# Patient Record
Sex: Female | Born: 1950 | Race: White | Hispanic: No | Marital: Married | Smoking: Never smoker
Health system: Southern US, Community
[De-identification: ages and names within clinical notes are randomized; demographics above are authoritative.]

## PROBLEM LIST (undated history)

## (undated) DIAGNOSIS — G8929 Other chronic pain: Secondary | ICD-10-CM

## (undated) DIAGNOSIS — R0683 Snoring: Secondary | ICD-10-CM

## (undated) DIAGNOSIS — I4891 Unspecified atrial fibrillation: Secondary | ICD-10-CM

## (undated) DIAGNOSIS — R252 Cramp and spasm: Secondary | ICD-10-CM

## (undated) DIAGNOSIS — R079 Chest pain, unspecified: Secondary | ICD-10-CM

## (undated) DIAGNOSIS — IMO0002 Reserved for concepts with insufficient information to code with codable children: Secondary | ICD-10-CM

## (undated) DIAGNOSIS — M797 Fibromyalgia: Secondary | ICD-10-CM

## (undated) DIAGNOSIS — F22 Delusional disorders: Secondary | ICD-10-CM

## (undated) DIAGNOSIS — R413 Other amnesia: Secondary | ICD-10-CM

## (undated) DIAGNOSIS — Z98811 Dental restoration status: Secondary | ICD-10-CM

## (undated) HISTORY — DX: Unspecified atrial fibrillation: I48.91

## (undated) HISTORY — PX: TONSILLECTOMY: SUR1361

## (undated) HISTORY — DX: Fibromyalgia: M79.7

## (undated) HISTORY — DX: Other amnesia: R41.3

## (undated) HISTORY — PX: TUBAL LIGATION: SHX77

## (undated) HISTORY — DX: Delusional disorders: F22

## (undated) HISTORY — DX: Reserved for concepts with insufficient information to code with codable children: IMO0002

## (undated) HISTORY — PX: DILATION AND CURETTAGE OF UTERUS: SHX78

## (undated) HISTORY — PX: COLONOSCOPY: SHX174

---

## 1999-05-03 ENCOUNTER — Other Ambulatory Visit: Admission: RE | Admit: 1999-05-03 | Discharge: 1999-05-03 | Payer: Self-pay | Admitting: *Deleted

## 1999-07-18 ENCOUNTER — Ambulatory Visit (HOSPITAL_COMMUNITY): Admission: RE | Admit: 1999-07-18 | Discharge: 1999-07-18 | Payer: Self-pay | Admitting: Gastroenterology

## 2000-05-19 ENCOUNTER — Other Ambulatory Visit: Admission: RE | Admit: 2000-05-19 | Discharge: 2000-05-19 | Payer: Self-pay | Admitting: *Deleted

## 2000-07-07 ENCOUNTER — Encounter: Payer: Self-pay | Admitting: Cardiology

## 2000-07-07 ENCOUNTER — Ambulatory Visit (HOSPITAL_COMMUNITY): Admission: RE | Admit: 2000-07-07 | Discharge: 2000-07-07 | Payer: Self-pay | Admitting: Cardiology

## 2001-08-23 ENCOUNTER — Other Ambulatory Visit: Admission: RE | Admit: 2001-08-23 | Discharge: 2001-08-23 | Payer: Self-pay | Admitting: *Deleted

## 2001-10-06 HISTORY — PX: ORIF WRIST FRACTURE: SHX2133

## 2001-10-20 ENCOUNTER — Ambulatory Visit (HOSPITAL_BASED_OUTPATIENT_CLINIC_OR_DEPARTMENT_OTHER): Admission: RE | Admit: 2001-10-20 | Discharge: 2001-10-20 | Payer: Self-pay | Admitting: *Deleted

## 2001-10-20 ENCOUNTER — Encounter (INDEPENDENT_AMBULATORY_CARE_PROVIDER_SITE_OTHER): Payer: Self-pay | Admitting: *Deleted

## 2002-10-06 HISTORY — PX: HARDWARE REMOVAL: SHX979

## 2003-08-15 ENCOUNTER — Other Ambulatory Visit: Admission: RE | Admit: 2003-08-15 | Discharge: 2003-08-15 | Payer: Self-pay | Admitting: Family Medicine

## 2003-08-26 ENCOUNTER — Encounter: Admission: RE | Admit: 2003-08-26 | Discharge: 2003-08-26 | Payer: Self-pay | Admitting: Family Medicine

## 2005-03-17 ENCOUNTER — Encounter: Payer: Self-pay | Admitting: Pulmonary Disease

## 2008-10-06 HISTORY — PX: CARDIAC CATHETERIZATION: SHX172

## 2009-02-14 ENCOUNTER — Encounter: Payer: Self-pay | Admitting: Emergency Medicine

## 2009-02-15 ENCOUNTER — Encounter: Payer: Self-pay | Admitting: Pulmonary Disease

## 2009-02-15 ENCOUNTER — Observation Stay (HOSPITAL_COMMUNITY): Admission: EM | Admit: 2009-02-15 | Discharge: 2009-02-15 | Payer: Self-pay | Admitting: Cardiology

## 2009-04-13 ENCOUNTER — Encounter: Payer: Self-pay | Admitting: Pulmonary Disease

## 2009-05-01 ENCOUNTER — Encounter: Payer: Self-pay | Admitting: *Deleted

## 2009-05-01 DIAGNOSIS — IMO0001 Reserved for inherently not codable concepts without codable children: Secondary | ICD-10-CM

## 2009-05-01 DIAGNOSIS — M1990 Unspecified osteoarthritis, unspecified site: Secondary | ICD-10-CM | POA: Insufficient documentation

## 2009-05-01 DIAGNOSIS — J129 Viral pneumonia, unspecified: Secondary | ICD-10-CM | POA: Insufficient documentation

## 2009-05-01 DIAGNOSIS — Z8679 Personal history of other diseases of the circulatory system: Secondary | ICD-10-CM

## 2009-05-02 ENCOUNTER — Ambulatory Visit: Payer: Self-pay | Admitting: Pulmonary Disease

## 2009-05-02 DIAGNOSIS — M129 Arthropathy, unspecified: Secondary | ICD-10-CM | POA: Insufficient documentation

## 2009-05-02 DIAGNOSIS — R0602 Shortness of breath: Secondary | ICD-10-CM | POA: Insufficient documentation

## 2009-05-14 ENCOUNTER — Ambulatory Visit: Payer: Self-pay | Admitting: Pulmonary Disease

## 2009-05-29 ENCOUNTER — Ambulatory Visit: Payer: Self-pay | Admitting: Pulmonary Disease

## 2011-01-14 LAB — CBC
HCT: 37.5 % (ref 36.0–46.0)
HCT: 40.7 % (ref 36.0–46.0)
Hemoglobin: 13 g/dL (ref 12.0–15.0)
Hemoglobin: 14 g/dL (ref 12.0–15.0)
MCHC: 34.6 g/dL (ref 30.0–36.0)
MCV: 91.7 fL (ref 78.0–100.0)
Platelets: 179 10*3/uL (ref 150–400)
RBC: 4.09 MIL/uL (ref 3.87–5.11)
RDW: 12.9 % (ref 11.5–15.5)
RDW: 12.7 % (ref 11.5–15.5)
WBC: 3.4 10*3/uL — ABNORMAL LOW (ref 4.0–10.5)

## 2011-01-14 LAB — PROTIME-INR
INR: 1 (ref 0.00–1.49)
Prothrombin Time: 13.9 seconds (ref 11.6–15.2)

## 2011-01-14 LAB — URINE CULTURE: Culture: NO GROWTH

## 2011-01-14 LAB — CK TOTAL AND CKMB (NOT AT ARMC)
CK, MB: 0.7 ng/mL (ref 0.3–4.0)
CK, MB: 0.9 ng/mL (ref 0.3–4.0)
Total CK: 47 U/L (ref 7–177)
Total CK: 55 U/L (ref 7–177)

## 2011-01-14 LAB — DIFFERENTIAL
Basophils Relative: 2 % — ABNORMAL HIGH (ref 0–1)
Eosinophils Absolute: 0 10*3/uL (ref 0.0–0.7)
Lymphs Abs: 0.3 10*3/uL — ABNORMAL LOW (ref 0.7–4.0)
Monocytes Absolute: 0.1 10*3/uL (ref 0.1–1.0)
Neutro Abs: 4.4 10*3/uL (ref 1.7–7.7)

## 2011-01-14 LAB — POCT CARDIAC MARKERS
Myoglobin, poc: 42.4 ng/mL (ref 12–200)
Troponin i, poc: <0.05 ng/mL (ref 0.00–0.09)

## 2011-01-14 LAB — BASIC METABOLIC PANEL
BUN: 9 mg/dL (ref 6–23)
CO2: 28 mEq/L (ref 19–32)
Calcium: 8.7 mg/dL (ref 8.4–10.5)
Chloride: 103 mEq/L (ref 96–112)
Creatinine, Ser: 0.9 mg/dL (ref 0.4–1.2)
GFR calc Af Amer: >60 mL/min (ref >60)
GFR calc non Af Amer: >60 mL/min (ref >60)
Glucose, Bld: 105 mg/dL — ABNORMAL HIGH (ref 70–99)
Potassium: 4 mEq/L (ref 3.5–5.1)
Sodium: 137 mEq/L (ref 135–145)

## 2011-01-14 LAB — COMPREHENSIVE METABOLIC PANEL
AST: 22 U/L (ref 0–37)
Albumin: 3.7 g/dL (ref 3.5–5.2)
Alkaline Phosphatase: 59 U/L (ref 39–117)
BUN: 12 mg/dL (ref 6–23)
Chloride: 105 mEq/L (ref 96–112)
GFR calc Af Amer: >60 mL/min (ref >60)
Potassium: 3.8 mEq/L (ref 3.5–5.1)
Total Bilirubin: 0.8 mg/dL (ref 0.3–1.2)

## 2011-01-14 LAB — URINALYSIS, ROUTINE W REFLEX MICROSCOPIC
Glucose, UA: NEGATIVE mg/dL
Nitrite: NEGATIVE
Protein, ur: NEGATIVE mg/dL

## 2011-01-14 LAB — APTT: aPTT: 63 seconds — ABNORMAL HIGH (ref 24–37)

## 2011-01-14 LAB — D-DIMER, QUANTITATIVE: D-Dimer, Quant: 0.33 ug/mL-FEU (ref 0.00–0.48)

## 2011-01-14 LAB — TSH: TSH: 2.693 u[IU]/mL (ref 0.350–4.500)

## 2011-01-14 LAB — TROPONIN I: Troponin I: 0.01 ng/mL (ref 0.00–0.06)

## 2011-02-13 ENCOUNTER — Other Ambulatory Visit: Payer: Self-pay | Admitting: Internal Medicine

## 2011-02-13 DIAGNOSIS — Z780 Asymptomatic menopausal state: Secondary | ICD-10-CM

## 2011-02-13 DIAGNOSIS — Z1231 Encounter for screening mammogram for malignant neoplasm of breast: Secondary | ICD-10-CM

## 2011-02-18 NOTE — Cardiovascular Report (Signed)
NAMEILLANA, NOLTING            ACCOUNT NO.:  [id number]   MEDICAL RECORD NO.:  [id number]          PATIENT TYPE:  INP   LOCATION:  2005                         FACILITY:  [facility]   PHYSICIAN:  Thereasa Solo. Little, M.D. DATE OF BIRTH:  December 17, 1950   DATE OF PROCEDURE:  02/15/2009  DATE OF DISCHARGE:                            CARDIAC CATHETERIZATION   This 60 year old female has remote history of PAF.  She had been tried  on beta-blockers in the past, but had episodes of significant  orthostasis on even moderate doses of Lopressor (50 b.i.d.).  She  presented last night to [facility] having had three episodes  in the last 4 days of chest discomfort associated with significant  palpitations.  She had no syncope with this, there was no particular  triggering event for this.  Her cardiac markers were unremarkable and  she was admitted.  She is brought to the Cath Lab to evaluate her for  coronary disease.   After obtaining informed consent, the patient was prepped and draped in  the usual sterile fashion exposing the right groin.  Following local  anesthetic with 1% Xylocaine, the Seldinger technique was employed and a  5-French introducer sheath was placed in the right femoral artery.  Left  and right coronary arteriography and ventriculography in the RAO  projection was performed.  A 3.5 JL catheter was used in order to  selectively engage the LAD.   COMPLICATIONS:  None.   Rhythm:  While she was being prepped on the table, she had an episode of  wide complex tachycardia that was 6 beat in duration and appeared to be  ventricular tachycardia.  The rate was about 130.  It resolved  spontaneously, but she did feel like this represented the ectopy she had  had at home.   MEDICATIONS:  1 mg IV Versed.   TOTAL CONTRAST:  90 mL.   RESULTS:  1. Hemodynamic monitoring.  Her central aortic pressure was 103/59.      Her left ventricular pressure was 103/-4.  Her left ventricular  end-      diastolic pressure was 9.  2. Ventriculography in the RAO projection done at the end of the      procedure revealed normal LV systolic function, ejection fraction      in excess of 55% and the end-diastolic pressure was 9.  3. Coronary arteriography:  On fluoroscopy, there was a single area of      calcium in the proximal LAD.      a.     Left main normal but bifurcated.      b.     Circumflex:  Circumflex was a very large dominant vessel.       The ongoing circumflex itself was about 4 mm.  There were three       large OMs, one of which bifurcated.  There was also a very large       PDA, all of which were free of disease.      c.     LAD.  The LAD was subselectively engaged.  It crossed the  apex of the heart and there were no areas of narrowing despite       some faint calcification proximally.  The first diagonal was also       free of disease.      d.     Right coronary artery.  Right coronary was a nondominant       vessel supplying only the RV free wall.  It was free of disease.   CONCLUSION:  1. No evidence of occlusive coronary disease.  2. Normal LV systolic function.  3. Nonsustained VT.   Since her LV function is normal and she does not have coronary disease,  the treatment choice for nonsustained VT should be beta-blockers.  I had  placed her on 12.5 mg b.i.d.  I will plan to ambulate her in the hall  and perhaps discharge her later today or tomorrow.   During the cath, her oxygen saturations got as low as 88%.  This was  after Versed but even prior to that, her O2 sats were only 91%.  I  ordered a D-dimer and if it is elevated, she will need a CT scan of her  chest to rule out PE.           ______________________________  Thereasa Solo. Little, M.D.     ABL/MEDQ  D:  02/15/2009  T:  02/15/2009  Job:  [id number]

## 2011-02-21 NOTE — Op Note (Signed)
[place]  Patient:    Alexandra Jackson, Alexandra Jackson Visit Number: [id number] MRN: [id number]          Service Type: Attending:  Lowell Bouton, M.D. Dictated by:   Lowell Bouton, M.D. Proc. Date: 10/20/01   CC:         Heather Roberts, M.D.   Operative Report  PREOPERATIVE DIAGNOSIS:  Status post open reduction and internal fixation, left distal radius fracture, with secondary rupture of flexor pollicis longus tendon, left wrist.  POSTOPERATIVE DIAGNOSIS:  Status post open reduction and internal fixation, left distal radius fracture, with secondary rupture of flexor pollicis longus tendon, left wrist.  PROCEDURE:  Removal of plate, left wrist, with palmaris longus interposition tendon graft to ruptured flexor pollicis longus tendon, left wrist, and release of A-1 pulley, left thumb.  SURGEON:  Lowell Bouton, M.D.  ANESTHESIA:  Axillary block.  OPERATIVE FINDINGS:  The patient had severe tenosynovitis around a volar buttress plate that was pointing up into the flexor canal.  The FPL had ruptured over a distance of about 4 cm.  The flexor digitorum profundus to the index finger had a pseudotendon.  The FPL stump was triggering beneath the A-1 pulley.  DESCRIPTION OF PROCEDURE:  Under axillary block anesthesia, with the tourniquet on the left arm, the left hand was prepped and draped in the usual fashion.  After exsanguinating the limb, the tourniquet was inflated to 225 mmHg.  The previous incision was opened longitudinally in the distal forearm and zigzagging across the wrist into the palm.  Sharp dissection was carried through the subcutaneous tissues, and bleeding points were coagulated. Blunt dissection was carried down through a significant amount of scar tissue to the median nerve.  The nerve was released in its entirety both proximally and distally.  It was retracted radially.  The patient was noted to have  a palmaris longus tendon just superficial to the median nerve.  There was a significant amount of flexor tenosynovitis which was debrided with the rongeur.  After opening the tenosynovium, the plate was identified on the distal radius and was sharply cleared off of soft tissue.  Four screws were removed along with the plate, and the remaining scar tissue was debrided with a rongeur.  After removing the plate, the tendons were examined, and the stump of the FPL was identified proximally.  It was freed up and pulled distally out to about the distal radius lip.  The thumb was then flexed, and the median nerve was retracted at the FPL canal.  Blunt dissection was carried up the canal trying to find the stump of the ruptured tendon.  After it was identified, it was pulled proximally into the carpal tunnel, and on passive range of motion of the thumb, it was noted that there was triggering.  At this point, it was felt that an A-1 pulley release would relieve the triggering. This was performed using a zigzag incision over the volar aspect of the MP joint of the thumb.  Blunt dissection was carried down to the flexor sheath, and the A-1 pulley was incised.  This relieved the triggering that occurred passively with pulling on the stump of the tendon.  At this point, a palmaris longus tendon graft was obtained and was woven with a tendon braider into the stump of the FPL.  Tenosynovitis was removed from around the proximal end of the distal stump.  The palmaris was braided through the FPL twice with a Pulvertaft weave, and  a 3-0 Ethibond suture was used to anchor the tendon. The proximal end of the tendon graft was then woven into the proximal muscle belly of the FPL.  It was woven through the tendon stump with a tendon braider using a Pulvertaft weave.  Tension was set by tenodesis.  The repair proximally was reinforced with 3-0 Ethibond suture.  The profundus of the index was then identified, and  it was found to have ruptured with pseudotendon formation that did flex the DIP of the index.  It was determined not to further release any of that tendon.  The wound was then irrigated copiously with saline.  A vessel loop drain was left in for drainage.  The thumb wound was closed with a 4-0 nylon, and the carpal tunnel incision was closed with a 4-0 nylon.  Suture of 4-0 Vicryl was used in the subcutaneous tissue proximally, and a 3-0 subcuticular Prolene was used to close the proximal wound.  Sterile dressings were applied followed by a dorsal splint with the thumb flexed.  The patient tolerated the procedure well and went to the recovery room awake, stable, and in good condition. Dictated by:   Lowell Bouton, M.D. Attending:  Lowell Bouton, M.D. DD:  10/20/01 TD:  10/20/01 Job: [phone]

## 2011-02-21 NOTE — Discharge Summary (Signed)
[name], [name]            ACCOUNT NO.:  [id number]   MEDICAL RECORD NO.:  [id number]          PATIENT TYPE:  INP   LOCATION:  2005                         FACILITY:  [facility]   PHYSICIAN:  Thereasa Solo. Little, M.D. DATE OF BIRTH:  21-Nov-1950   DATE OF ADMISSION:  02/15/2009  DATE OF DISCHARGE:  02/15/2009                               DISCHARGE SUMMARY   DISCHARGE DIAGNOSES:  1. Chest pain, felt to be noncardiac, catheterization this admission      revealing:      a.     Essentially normal coronaries.      b.     Good left ventricular function.  2. Nonsustained ventricular tachycardia, discharged on low-dose beta-      blocker.  3. Past history of paroxysmal atrial fibrillation.  4. History of fibromyalgia.   HOSPITAL COURSE:  The patient is a 60 year old female who presented at  [facility] Room with chest pain associated with dyspnea.  EKG  showed sinus rhythm and sinus tach.  Troponins were negative.  The  patient was seen by Dr. Clarene Duke.  She was started on heparin and nitrates  for unstable angina.  Her enzymes continued to be negative and Dr.  Clarene Duke decided to take her to cath lab, Feb 15, 2009.  This revealed  essentially normal coronaries.  The patient has had some tachycardia in  the past.  She did not tolerate the higher dose of metoprolol, so she  was started on 12.5 mg b.i.d.  Dr. Clarene Duke felt she could be discharged  today off her cath later in the day.   DISCHARGE MEDICATIONS:  1. Aspirin 325 mg a day.  2. Iron daily.  3. Calcium daily.  4. Metoprolol 12.5 mg b.i.d.   LABORATORY DATA:  EKG shows sinus rhythm without acute changes.  White  count 3.4, hemoglobin 13, hematocrit 37.5, and platelets 179.  INR 1.0.  Sodium 137, potassium 4.0, BUN 9, and creatinine 0.9.  CK-MB and  troponins are negative.  She does have a telemetry showing 60 run of  nonsustained wide complex tach.   DISPOSITION:  The patient is discharged in stable condition and will  follow up with Dr. Clarene Duke.      Abelino Derrick, P.A.    ______________________________  Thereasa Solo. Little, M.D.    Lenard Lance  D:  03/21/2009  T:  03/22/2009  Job:  [id number]

## 2011-03-07 ENCOUNTER — Ambulatory Visit
Admission: RE | Admit: 2011-03-07 | Discharge: 2011-03-07 | Disposition: A | Discharge status: Home / Self Care | Payer: 59 | Source: Ambulatory Visit | Attending: Internal Medicine | Admitting: Internal Medicine

## 2011-03-07 DIAGNOSIS — Z780 Asymptomatic menopausal state: Secondary | ICD-10-CM

## 2011-03-07 DIAGNOSIS — Z1231 Encounter for screening mammogram for malignant neoplasm of breast: Secondary | ICD-10-CM

## 2011-11-12 ENCOUNTER — Telehealth: Payer: Self-pay

## 2012-01-21 NOTE — Telephone Encounter (Signed)
Junk test 

## 2012-02-12 ENCOUNTER — Other Ambulatory Visit: Payer: Self-pay | Admitting: Emergency Medicine

## 2012-02-12 ENCOUNTER — Ambulatory Visit (INDEPENDENT_AMBULATORY_CARE_PROVIDER_SITE_OTHER): Payer: 59 | Admitting: Emergency Medicine

## 2012-02-12 ENCOUNTER — Ambulatory Visit
Admission: RE | Admit: 2012-02-12 | Discharge: 2012-02-12 | Disposition: A | Discharge status: Home / Self Care | Payer: Self-pay | Source: Ambulatory Visit | Attending: Emergency Medicine | Admitting: Emergency Medicine

## 2012-02-12 VITALS — BP 106/68 | HR 68 | Temp 98.1°F | Resp 16 | Ht 69.25 in | Wt 151.4 lb

## 2012-02-12 DIAGNOSIS — R42 Dizziness and giddiness: Secondary | ICD-10-CM

## 2012-02-12 DIAGNOSIS — S0990XA Unspecified injury of head, initial encounter: Secondary | ICD-10-CM

## 2012-02-12 NOTE — Progress Notes (Signed)
  Subjective:    Patient ID: Alexandra Jackson, female    DOB: Apr 14, 1951, 61 y.o.   MRN: [id number]  HPI     COMPLETED NOTE IS IN PAPER CHART AS IT WAS DISCOVERED DURING VISIT THIS IS A WORKER'S COMP CASE AND FURTHER DOCUMENTATION IN EPIC WAS UNNECESSARY. PATIENT DID NOT REALIZE SHE NEEDED TO CATEGORIZE THIS AT CHECK IN.     Ms. Blumenberg comes in today c/o slipping on Monday when running to car in rain. She was trying to get in her car and slipped and hit her head on door frame.  She denies LOC although felt stunned and had episodes of confusion.  She was able to get into car and drive without problems but noticed acute swelling over contused area on right forehead.  Superficial wound with small amount of bleeding initially as well.  Mild nausea that day but not until later and denies vomiting. Has had some dizziness but states that she has history of dizziness and states that these dizzy episodes are not different than her normal. She has had mild HA since Monday on/off. She woke this morning with significant infraorbital ecchymosis and felt she needed to be evaluated.   She does not have a history of head trauma.  She did have some type of scan in her 39's for her dizzy episodes and she reports that it was normal.  Past Medical History  Diagnosis Date  . Atrial fibrillation   . Fibromyalgia   . Degenerative disc disease   . Heart murmur     Current Outpatient Prescriptions on File Prior to Visit  Medication Sig Dispense Refill  . digoxin (LANOXIN) 0.25 MG tablet Take 250 mcg by mouth daily.      . pindolol (VISKEN) 5 MG tablet Take 5 mg by mouth 2 (two) times daily.      . potassium chloride (K-DUR,KLOR-CON) 10 MEQ tablet Take 10 mEq by mouth 2 (two) times daily.         Review of Systems  Constitutional: Positive for fatigue. Negative for fever and chills.  HENT: Positive for facial swelling. Negative for neck pain.   Eyes: Positive for visual disturbance (double vision).  Negative for photophobia and pain.  Cardiovascular: Negative for palpitations.  Gastrointestinal: Positive for nausea. Negative for vomiting.  Musculoskeletal: Negative for myalgias.  Neurological: Positive for dizziness, light-headedness and headaches. Negative for weakness.  Psychiatric/Behavioral: Positive for confusion.       Objective:   Physical Exam  Constitutional: Vital signs are normal. She appears well-developed and well-nourished. No distress.  HENT:  Head: Head is with raccoon's eyes (infraborbital ecchymosis bilateral) and with laceration.    Right Ear: Tympanic membrane normal.  Left Ear: Tympanic membrane normal.  Nose: Nose normal.  Mouth/Throat: Oropharynx is clear and moist.  Eyes: EOM and lids are normal. Pupils are equal, round, and reactive to light. Right conjunctiva is injected. Right conjunctiva has no hemorrhage. Left conjunctiva is injected. Left conjunctiva has no hemorrhage. No scleral icterus.  Fundoscopic exam:      The right eye shows no hemorrhage and no papilledema.       The left eye shows no hemorrhage and no papilledema.  Neck: Normal range of motion.  Cardiovascular: Normal rate and regular rhythm.   Pulmonary/Chest: Effort normal and breath sounds normal.  Lymphadenopathy:    She has no cervical adenopathy.  Neurological: She is alert.           Assessment & Plan:

## 2012-05-07 ENCOUNTER — Encounter: Payer: Self-pay | Admitting: Emergency Medicine

## 2013-01-04 HISTORY — PX: NM MYOVIEW LTD: HXRAD82

## 2013-01-05 ENCOUNTER — Ambulatory Visit
Admission: RE | Admit: 2013-01-05 | Discharge: 2013-01-05 | Disposition: A | Discharge status: Home / Self Care | Payer: 59 | Source: Ambulatory Visit | Attending: Cardiology | Admitting: Cardiology

## 2013-01-05 ENCOUNTER — Other Ambulatory Visit: Payer: Self-pay | Admitting: Cardiology

## 2013-01-05 ENCOUNTER — Other Ambulatory Visit (HOSPITAL_COMMUNITY): Payer: Self-pay | Admitting: Cardiology

## 2013-01-05 DIAGNOSIS — R079 Chest pain, unspecified: Secondary | ICD-10-CM

## 2013-01-05 DIAGNOSIS — R0602 Shortness of breath: Secondary | ICD-10-CM

## 2013-01-11 ENCOUNTER — Ambulatory Visit (HOSPITAL_COMMUNITY)
Admission: RE | Admit: 2013-01-11 | Discharge: 2013-01-11 | Disposition: A | Discharge status: Home / Self Care | Payer: 59 | Source: Ambulatory Visit | Attending: Cardiology | Admitting: Cardiology

## 2013-01-11 DIAGNOSIS — I4891 Unspecified atrial fibrillation: Secondary | ICD-10-CM | POA: Insufficient documentation

## 2013-01-11 DIAGNOSIS — Z87891 Personal history of nicotine dependence: Secondary | ICD-10-CM | POA: Insufficient documentation

## 2013-01-11 DIAGNOSIS — J45909 Unspecified asthma, uncomplicated: Secondary | ICD-10-CM | POA: Insufficient documentation

## 2013-01-11 DIAGNOSIS — I472 Ventricular tachycardia, unspecified: Secondary | ICD-10-CM | POA: Insufficient documentation

## 2013-01-11 DIAGNOSIS — Z8249 Family history of ischemic heart disease and other diseases of the circulatory system: Secondary | ICD-10-CM | POA: Insufficient documentation

## 2013-01-11 DIAGNOSIS — R079 Chest pain, unspecified: Secondary | ICD-10-CM | POA: Insufficient documentation

## 2013-01-11 DIAGNOSIS — I4729 Other ventricular tachycardia: Secondary | ICD-10-CM | POA: Insufficient documentation

## 2013-01-11 DIAGNOSIS — R0602 Shortness of breath: Secondary | ICD-10-CM | POA: Insufficient documentation

## 2013-01-11 DIAGNOSIS — R002 Palpitations: Secondary | ICD-10-CM | POA: Insufficient documentation

## 2013-01-11 DIAGNOSIS — R42 Dizziness and giddiness: Secondary | ICD-10-CM | POA: Insufficient documentation

## 2013-01-11 MED ORDER — TECHNETIUM TC 99M SESTAMIBI GENERIC - CARDIOLITE
10.0000 | Freq: Once | INTRAVENOUS | Status: AC | PRN
  Administered 2013-01-11: 10 via INTRAVENOUS

## 2013-01-11 MED ORDER — AMINOPHYLLINE 25 MG/ML IV SOLN
100.0000 mg | Freq: Once | INTRAVENOUS | Status: AC
  Administered 2013-01-11: 100 mg via INTRAVENOUS

## 2013-01-11 MED ORDER — TECHNETIUM TC 99M SESTAMIBI GENERIC - CARDIOLITE
30.0000 | Freq: Once | INTRAVENOUS | Status: AC | PRN
  Administered 2013-01-11: 30 via INTRAVENOUS

## 2013-01-11 MED ORDER — REGADENOSON 0.4 MG/5ML IV SOLN
0.4000 mg | Freq: Once | INTRAVENOUS | Status: AC
  Administered 2013-01-11: 0.4 mg via INTRAVENOUS

## 2013-01-11 NOTE — Procedures (Addendum)
[facility] [address] [place] [address] [place] [address] [phone]  Cardiology Nuclear Med Study  ADDELINE CALARCO is a 62 y.o. female     MRN : [id number]     DOB: Mar 03, 1951  Procedure Date: 01/11/2013  Nuclear Med Background Indication for Stress Test:  Evaluation for Ischemia History:  Asthma and A-FIB;NSVT Cardiac Risk Factors: Family History - CAD and History of Smoking  Symptoms:  Chest Pain, Dizziness, Palpitations and SOB   Nuclear Pre-Procedure Caffeine/Decaff Intake:  1:00am NPO After: 11 AM   IV Site: R Forearm  IV 0.9% NS with Angio Cath:  22g  Chest Size (in):  N/A IV Started by: Emmit Pomfret, RN  Height: 5\' 10"  (1.778 m)  Cup Size: AA  BMI:  Body mass index is 19.8 kg/(m^2). Weight:  138 lb (62.596 kg)   Tech Comments:  N/A    Nuclear Med Study 1 or 2 day study: 1 day  Stress Test Type:  Lexiscan  Order Authorizing Provider:  Bryan Lemma, MD   Resting Radionuclide: Technetium 31m Sestamibi  Resting Radionuclide Dose: 9.8 mCi   Stress Radionuclide:  Technetium 71m Sestamibi  Stress Radionuclide Dose: 29.3 mCi           Stress Protocol Rest HR: 71 Stress HR: 120  Rest BP: 133/86 Stress BP: 147/87  Exercise Time (min): n/a METS: n/a   Predicted Max HR: 157 bpm % Max HR: 76.43 bpm Rate Pressure Product: 69629  Dose of Adenosine (mg):  n/a Dose of Lexiscan: 0.4 mg  Dose of Atropine (mg): n/a Dose of Dobutamine: n/a mcg/kg/min (at max HR)  Stress Test Technologist: Esperanza Sheets, CCT Nuclear Technologist: Koren Shiver, CNMT   Rest Procedure:  Myocardial perfusion imaging was performed at rest 45 minutes following the intravenous administration of Technetium 65m Sestamibi. Stress Procedure:  The patient received IV Lexiscan 0.4 mg over 15-seconds.  Technetium 40m Sestamibi injected at 30-seconds.  The patient exerienced SOB, Dizziness, Stomach Pain, Headache and Chest tingling. 75 mg of IV  Aminophylline was administered.  There was ST-T Changes in II, III, F,  V4-V6 with the Lexiscan.  Quantitative spect images were obtained after a 45 minute delay.  Transient Ischemic Dilatation (Normal <1.22):  0.88 Lung/Heart Ratio (Normal <0.45):  0.27 QGS EDV:  76 ml QGS ESV:  21 ml LV Ejection Fraction: 73% .   Rest ECG: NSR with non-specific ST-T wave changes  Stress ECG: Deeply inverted inferolateral T waves, rare PAC and PVC  QPS Raw Data Images:  Normal; no motion artifact; normal heart/lung ratio. Stress Images:  Normal homogeneous uptake in all areas of the myocardium. Rest Images:  Normal homogeneous uptake in all areas of the myocardium. Subtraction (SDS):  Normal  Impression Exercise Capacity:  Lexiscan with no exercise. BP Response:  Normal blood pressure response. Clinical Symptoms:  Pt expressed dyspnea, dizziness, stomach pain and chest tingles ECG Impression:  Abnormal inferolateral STT changes with rare PVC and PAC Comparison with Prior Nuclear Study: No significant change from previous study  Overall Impression:  Abnormal ECG response to lexiscan with normal myocardial perfusion images. Low risk stress nuclear study.  LV Wall Motion:  NL LV Function, EF 73%; NL Wall Motion   Lennette Bihari, MD  01/11/2013 6:13 PM

## 2013-01-13 ENCOUNTER — Encounter (HOSPITAL_COMMUNITY): Payer: Self-pay

## 2013-04-18 ENCOUNTER — Ambulatory Visit (INDEPENDENT_AMBULATORY_CARE_PROVIDER_SITE_OTHER): Payer: 59 | Admitting: Neurology

## 2013-04-18 ENCOUNTER — Ambulatory Visit (INDEPENDENT_AMBULATORY_CARE_PROVIDER_SITE_OTHER): Payer: 59

## 2013-04-18 DIAGNOSIS — M62838 Other muscle spasm: Secondary | ICD-10-CM

## 2013-04-18 DIAGNOSIS — R748 Abnormal levels of other serum enzymes: Secondary | ICD-10-CM

## 2013-04-18 DIAGNOSIS — G544 Lumbosacral root disorders, not elsewhere classified: Secondary | ICD-10-CM

## 2013-04-18 DIAGNOSIS — Z0289 Encounter for other administrative examinations: Secondary | ICD-10-CM

## 2013-04-18 NOTE — Procedures (Signed)
  HISTORY:  Alexandra Jackson is a 62 year old patient with a history of myalgias and muscle cramps since age 57. The patient has had worsening symptoms over time. The patient has low back pain, and pain in the shoulders, arms, and legs. The patient will have muscle cramps daily, and she has been noted to have an elevation of CK enzyme levels. The patient reports no weakness. The patient has lumbosacral scoliosis. The patient is being evaluated for a possible myopathy or a radiculopathy.  NERVE CONDUCTION STUDIES:  Nerve conduction studies were performed on both lower extremities. The distal motor latencies and motor amplitudes for the peroneal and posterior tibial nerves were within normal limits. The nerve conduction velocities for these nerves were also normal. The H reflex latencies were normal. The sensory latencies for the peroneal nerves were within normal limits.   EMG STUDIES:  EMG study was performed on the right lower extremity:  The tibialis anterior muscle reveals 2 to 4K motor units with full recruitment. No fibrillations or positive waves were seen. The peroneus tertius muscle reveals 2 to 4K motor units with full recruitment. No fibrillations or positive waves were seen. The medial gastrocnemius muscle reveals 1 to 3K motor units with full recruitment. No fibrillations or positive waves were seen. The vastus lateralis muscle reveals 2 to 4K motor units with full recruitment. No fibrillations or positive waves were seen. The iliopsoas muscle reveals 2 to 4K motor units with full recruitment. No fibrillations or positive waves were seen. The biceps femoris muscle (long head) reveals 2 to 4K motor units with full recruitment. No fibrillations or positive waves were seen. The lumbosacral paraspinal muscles were tested at 3 levels, and revealed no abnormalities of insertional activity at all 3 levels tested. There was good relaxation.  EMG study was performed on the left lower  extremity:  The tibialis anterior muscle reveals 2 to 4K motor units with full recruitment. No fibrillations or positive waves were seen. The peroneus tertius muscle reveals 2 to 5K motor units with slightly decreased recruitment. No fibrillations or positive waves were seen. The medial gastrocnemius muscle reveals 1 to 3K motor units with full recruitment. One plus fibrillations and positive waves were seen. The vastus lateralis muscle reveals 2 to 4K motor units with full recruitment. No fibrillations or positive waves were seen. The iliopsoas muscle reveals 2 to 4K motor units with full recruitment. No fibrillations or positive waves were seen. The biceps femoris muscle (long head) reveals 2 to 4K motor units with full recruitment. No fibrillations or positive waves were seen. The lumbosacral paraspinal muscles were tested at 3 levels, and revealed no abnormalities of insertional activity at the upper level, with complex repetitive discharges in the middle and lower levels. There was good relaxation.   IMPRESSION:  Nerve conduction studies done on both lower extremities were unremarkable, without evidence of a peripheral neuropathy. EMG evaluation of the right lower extremity is relatively unremarkable, without evidence of a myopathic disorder or a lumbosacral radiculopathy. EMG evaluation of the left lower extremity shows findings consistent with a low-grade acute S1 radiculopathy. No evidence of a myopathic disorder is noted.  Marlan Palau MD 04/18/2013 4:17 PM  [facility] [address] [place], [place] [address]  Phone [phone] Fax [phone]

## 2013-10-21 ENCOUNTER — Other Ambulatory Visit: Payer: Self-pay | Admitting: *Deleted

## 2013-10-21 MED ORDER — PINDOLOL 5 MG PO TABS: 2.5000 mg | ORAL_TABLET | Freq: Every day | ORAL | Status: DC

## 2013-10-21 MED ORDER — DIGOXIN 125 MCG PO TABS: 125.0000 ug | ORAL_TABLET | Freq: Every day | ORAL | Status: DC

## 2013-10-21 NOTE — Telephone Encounter (Signed)
Walk-In Message received requesting a refill be approved before Jan. 21st.    Returned call and pt verified x 2.  Pt informed message received and needed clarification r/t pharmacies: [facility] [address] and [facility].  Pt stated she [facility]oxin and [facility] not [facility].  Pt confirmed dosages and Refill(s) sent to pharmacy for 90-day w/ one refill.  Pt aware appt due May 2015 w/ Dr. Herbie BaltimoreHarding and will call back if no call by early April.  Pt verbalized understanding and agreed w/ plan.

## 2013-10-27 ENCOUNTER — Encounter (HOSPITAL_BASED_OUTPATIENT_CLINIC_OR_DEPARTMENT_OTHER): Payer: Self-pay | Admitting: *Deleted

## 2013-10-27 ENCOUNTER — Other Ambulatory Visit: Payer: Self-pay

## 2013-10-27 ENCOUNTER — Encounter (HOSPITAL_BASED_OUTPATIENT_CLINIC_OR_DEPARTMENT_OTHER)
Admission: RE | Admit: 2013-10-27 | Discharge: 2013-10-27 | Disposition: A | Discharge status: Home / Self Care | Payer: 59 | Source: Ambulatory Visit | Attending: Plastic Surgery | Admitting: Plastic Surgery

## 2013-10-27 DIAGNOSIS — Z01812 Encounter for preprocedural laboratory examination: Secondary | ICD-10-CM | POA: Insufficient documentation

## 2013-10-27 DIAGNOSIS — Z01810 Encounter for preprocedural cardiovascular examination: Secondary | ICD-10-CM | POA: Insufficient documentation

## 2013-10-27 DIAGNOSIS — Z01818 Encounter for other preprocedural examination: Secondary | ICD-10-CM | POA: Insufficient documentation

## 2013-10-27 LAB — POCT I-STAT, CHEM 8
BUN: 12 mg/dL (ref 6–23)
CHLORIDE: 107 meq/L (ref 96–112)
Calcium, Ion: 1.05 mmol/L — ABNORMAL LOW (ref 1.13–1.30)
Creatinine, Ser: 1 mg/dL (ref 0.50–1.10)
GLUCOSE: 95 mg/dL (ref 70–99)
HEMATOCRIT: 48 % — AB (ref 36.0–46.0)
Hemoglobin: 16.3 g/dL — ABNORMAL HIGH (ref 12.0–15.0)
POTASSIUM: 4.8 meq/L (ref 3.7–5.3)
SODIUM: 139 meq/L (ref 137–147)
TCO2: 25 mmol/L (ref 0–100)

## 2013-10-27 NOTE — Progress Notes (Signed)
To come in for bmet-ekg-very anxious Says she has cardiac problems and wondered about anesthesia consult-she has had hx AF -irreg hr abd has seen dr Caprice KluverAl Little and dr harding for yrs-had a stress test 4/14 completely normal with ef 73% Had a cath 2910 completely no evidence CAD-has chronic chest wall pain with multiple work up-neg Did review with dr Sampson GoonFitzgerald To come in for ekg bmet

## 2013-10-31 ENCOUNTER — Other Ambulatory Visit (HOSPITAL_COMMUNITY): Payer: Self-pay | Admitting: Plastic Surgery

## 2013-10-31 ENCOUNTER — Other Ambulatory Visit: Payer: Self-pay | Admitting: Plastic Surgery

## 2013-11-01 ENCOUNTER — Ambulatory Visit (HOSPITAL_BASED_OUTPATIENT_CLINIC_OR_DEPARTMENT_OTHER)
Admission: RE | Admit: 2013-11-01 | Discharge: 2013-11-01 | Disposition: A | Discharge status: Home / Self Care | Payer: 59 | Source: Ambulatory Visit | Attending: Plastic Surgery | Admitting: Plastic Surgery

## 2013-11-01 ENCOUNTER — Encounter (HOSPITAL_BASED_OUTPATIENT_CLINIC_OR_DEPARTMENT_OTHER): Payer: 59 | Admitting: Anesthesiology

## 2013-11-01 ENCOUNTER — Ambulatory Visit (HOSPITAL_BASED_OUTPATIENT_CLINIC_OR_DEPARTMENT_OTHER): Payer: 59 | Admitting: Anesthesiology

## 2013-11-01 ENCOUNTER — Encounter (HOSPITAL_BASED_OUTPATIENT_CLINIC_OR_DEPARTMENT_OTHER)
Admission: RE | Disposition: A | Discharge status: Home / Self Care | Payer: Self-pay | Source: Ambulatory Visit | Attending: Plastic Surgery

## 2013-11-01 ENCOUNTER — Encounter (HOSPITAL_BASED_OUTPATIENT_CLINIC_OR_DEPARTMENT_OTHER): Payer: Self-pay | Admitting: *Deleted

## 2013-11-01 DIAGNOSIS — I4891 Unspecified atrial fibrillation: Secondary | ICD-10-CM | POA: Insufficient documentation

## 2013-11-01 DIAGNOSIS — H0230 Blepharochalasis unspecified eye, unspecified eyelid: Secondary | ICD-10-CM | POA: Insufficient documentation

## 2013-11-01 HISTORY — DX: Cramp and spasm: R25.2

## 2013-11-01 HISTORY — DX: Other chronic pain: G89.29

## 2013-11-01 HISTORY — DX: Chest pain, unspecified: R07.9

## 2013-11-01 HISTORY — PX: BROW LIFT: SHX178

## 2013-11-01 HISTORY — DX: Snoring: R06.83

## 2013-11-01 HISTORY — DX: Dental restoration status: Z98.811

## 2013-11-01 LAB — POCT HEMOGLOBIN-HEMACUE: HEMOGLOBIN: 14.1 g/dL (ref 12.0–15.0)

## 2013-11-01 SURGERY — BLEPHAROPLASTY
Anesthesia: Monitor Anesthesia Care | Laterality: Bilateral

## 2013-11-01 MED ORDER — LIDOCAINE-EPINEPHRINE 0.5 %-1:200000 IJ SOLN
INTRAMUSCULAR | Status: AC
  Filled 2013-11-01: qty 1

## 2013-11-01 MED ORDER — CEFAZOLIN SODIUM-DEXTROSE 2-3 GM-% IV SOLR
2.0000 g | INTRAVENOUS | Status: AC
  Administered 2013-11-01: 2 g via INTRAVENOUS

## 2013-11-01 MED ORDER — FENTANYL CITRATE 0.05 MG/ML IJ SOLN: 50.0000 ug | Freq: Once | INTRAMUSCULAR | Status: DC

## 2013-11-01 MED ORDER — SODIUM BICARBONATE 4 % IV SOLN
INTRAVENOUS | Status: AC
  Filled 2013-11-01: qty 5

## 2013-11-01 MED ORDER — FENTANYL CITRATE 0.05 MG/ML IJ SOLN
INTRAMUSCULAR | Status: AC
  Filled 2013-11-01: qty 6

## 2013-11-01 MED ORDER — LIDOCAINE-EPINEPHRINE 1 %-1:100000 IJ SOLN
INTRAMUSCULAR | Status: DC | PRN
  Administered 2013-11-01: 6.5 mL

## 2013-11-01 MED ORDER — NEOMYCIN-POLYMYXIN-DEXAMETH 3.5-10000-0.1 OP OINT
TOPICAL_OINTMENT | OPHTHALMIC | Status: AC
  Filled 2013-11-01: qty 3.5

## 2013-11-01 MED ORDER — WHITE PETROLATUM GEL
Status: AC
  Filled 2013-11-01: qty 5

## 2013-11-01 MED ORDER — HYDROMORPHONE HCL PF 1 MG/ML IJ SOLN
INTRAMUSCULAR | Status: AC
  Filled 2013-11-01: qty 1

## 2013-11-01 MED ORDER — BSS IO SOLN
INTRAOCULAR | Status: AC
  Filled 2013-11-01: qty 15

## 2013-11-01 MED ORDER — FENTANYL CITRATE 0.05 MG/ML IJ SOLN
INTRAMUSCULAR | Status: DC | PRN
  Administered 2013-11-01 (×2): 50 ug via INTRAVENOUS

## 2013-11-01 MED ORDER — PROPOFOL INFUSION 10 MG/ML OPTIME
INTRAVENOUS | Status: DC | PRN
  Administered 2013-11-01: 100 ug/kg/min via INTRAVENOUS

## 2013-11-01 MED ORDER — HYDROMORPHONE HCL PF 1 MG/ML IJ SOLN
0.2500 mg | INTRAMUSCULAR | Status: DC | PRN
  Administered 2013-11-01: 0.5 mg via INTRAVENOUS
  Administered 2013-11-01 (×2): 0.25 mg via INTRAVENOUS

## 2013-11-01 MED ORDER — LIDOCAINE HCL (CARDIAC) 20 MG/ML IV SOLN
INTRAVENOUS | Status: DC | PRN
  Administered 2013-11-01: 50 mg via INTRAVENOUS

## 2013-11-01 MED ORDER — LIDOCAINE-EPINEPHRINE 1 %-1:100000 IJ SOLN
INTRAMUSCULAR | Status: AC
  Filled 2013-11-01: qty 1

## 2013-11-01 MED ORDER — OXYCODONE HCL 5 MG/5ML PO SOLN: 5.0000 mg | Freq: Once | ORAL | Status: DC | PRN

## 2013-11-01 MED ORDER — LACTATED RINGERS IV SOLN
INTRAVENOUS | Status: DC
  Administered 2013-11-01: 10 mL/h via INTRAVENOUS
  Administered 2013-11-01: 11:00:00 via INTRAVENOUS

## 2013-11-01 MED ORDER — FENTANYL CITRATE 0.05 MG/ML IJ SOLN: 50.0000 ug | INTRAMUSCULAR | Status: DC | PRN

## 2013-11-01 MED ORDER — MIDAZOLAM HCL 2 MG/2ML IJ SOLN
INTRAMUSCULAR | Status: AC
  Filled 2013-11-01: qty 4

## 2013-11-01 MED ORDER — PROMETHAZINE HCL 25 MG/ML IJ SOLN: 6.2500 mg | INTRAMUSCULAR | Status: DC | PRN

## 2013-11-01 MED ORDER — OXYCODONE HCL 5 MG PO TABS: 5.0000 mg | ORAL_TABLET | Freq: Once | ORAL | Status: DC | PRN

## 2013-11-01 MED ORDER — CEFAZOLIN SODIUM-DEXTROSE 2-3 GM-% IV SOLR
INTRAVENOUS | Status: AC
  Filled 2013-11-01: qty 50

## 2013-11-01 MED ORDER — MIDAZOLAM HCL 5 MG/5ML IJ SOLN
INTRAMUSCULAR | Status: DC | PRN
  Administered 2013-11-01 (×2): 1 mg via INTRAVENOUS

## 2013-11-01 MED ORDER — BSS IO SOLN
INTRAOCULAR | Status: DC | PRN
  Administered 2013-11-01: 15 mL via INTRAOCULAR

## 2013-11-01 MED ORDER — MIDAZOLAM HCL 2 MG/2ML IJ SOLN: 1.0000 mg | INTRAMUSCULAR | Status: DC | PRN

## 2013-11-01 SURGICAL SUPPLY — 31 items
APPLICATOR COTTON TIP 6IN STRL (MISCELLANEOUS) IMPLANT
APPLICATOR DR MATTHEWS STRL (MISCELLANEOUS) ×3 IMPLANT
BANDAGE EYE OVAL (MISCELLANEOUS) IMPLANT
BLADE SURG 15 STRL LF DISP TIS (BLADE) ×1 IMPLANT
BLADE SURG 15 STRL SS (BLADE) ×2
COVER MAYO STAND STRL (DRAPES) ×3 IMPLANT
COVER TABLE BACK 60X90 (DRAPES) ×3 IMPLANT
DECANTER SPIKE VIAL GLASS SM (MISCELLANEOUS) IMPLANT
DRAPE U-SHAPE 76X120 STRL (DRAPES) ×3 IMPLANT
ELECT NEEDLE BLADE 2-5/6 (NEEDLE) ×3 IMPLANT
ELECT NEEDLE TIP 2.8 STRL (NEEDLE) IMPLANT
ELECT REM PT RETURN 9FT ADLT (ELECTROSURGICAL) ×3
ELECTRODE REM PT RTRN 9FT ADLT (ELECTROSURGICAL) ×1 IMPLANT
GLOVE BIO SURGEON STRL SZ7.5 (GLOVE) ×3 IMPLANT
GLOVE BIOGEL PI IND STRL 8 (GLOVE) ×1 IMPLANT
GLOVE BIOGEL PI INDICATOR 8 (GLOVE) ×2
GOWN STRL REUS W/ TWL LRG LVL3 (GOWN DISPOSABLE) ×1 IMPLANT
GOWN STRL REUS W/ TWL XL LVL3 (GOWN DISPOSABLE) ×1 IMPLANT
GOWN STRL REUS W/TWL LRG LVL3 (GOWN DISPOSABLE) ×2
GOWN STRL REUS W/TWL XL LVL3 (GOWN DISPOSABLE) ×2
NEEDLE HYPO 30X.5 LL (NEEDLE) ×3 IMPLANT
PACK BASIN DAY SURGERY FS (CUSTOM PROCEDURE TRAY) ×3 IMPLANT
PENCIL BUTTON HOLSTER BLD 10FT (ELECTRODE) ×3 IMPLANT
SHEET MEDIUM DRAPE 40X70 STRL (DRAPES) ×3 IMPLANT
SHEILD EYE MED CORNL SHD 22X21 (OPHTHALMIC RELATED)
SHIELD EYE MED CORNL SHD 22X21 (OPHTHALMIC RELATED) IMPLANT
SLEEVE SCD COMPRESS KNEE MED (MISCELLANEOUS) IMPLANT
SUT PROLENE 6 0 P 1 18 (SUTURE) ×6 IMPLANT
SUT SILK 6 0 P 1 (SUTURE) IMPLANT
SYR CONTROL 10ML LL (SYRINGE) ×3 IMPLANT
TOWEL OR 17X24 6PK STRL BLUE (TOWEL DISPOSABLE) ×6 IMPLANT

## 2013-11-01 NOTE — H&P (Signed)
I have re-examined and re-evaluated the patient and there are no changes. See office notes in paper chart.  Planned procedure: bilateral upper lid blepharoplasty

## 2013-11-01 NOTE — Anesthesia Postprocedure Evaluation (Signed)
  Anesthesia Post-op Note  Patient: Alexandra Jackson  Procedure(s) Performed: Procedure(s): BILATERAL UPPER BLEPHAROPLASTY (Bilateral)  Patient Location: PACU  Anesthesia Type:MAC  Level of Consciousness: awake and alert   Airway and Oxygen Therapy: Patient Spontanous Breathing  Post-op Pain: mild  Post-op Assessment: Post-op Vital signs reviewed, Patient's Cardiovascular Status Stable, Respiratory Function Stable, Patent Airway, No signs of Nausea or vomiting and Pain level controlled  Post-op Vital Signs: Reviewed and stable  Complications: No apparent anesthesia complications

## 2013-11-01 NOTE — Anesthesia Preprocedure Evaluation (Signed)
Anesthesia Evaluation  Patient identified by MRN, date of birth, ID band Patient awake    Reviewed: Allergy & Precautions, H&P , NPO status , Patient's Chart, lab work & pertinent test results  Airway Mallampati: I TM Distance: >3 FB Neck ROM: Full    Dental   Pulmonary  breath sounds clear to auscultation        Cardiovascular + dysrhythmias Atrial Fibrillation Rhythm:Regular Rate:Normal     Neuro/Psych    GI/Hepatic   Endo/Other    Renal/GU      Musculoskeletal   Abdominal   Peds  Hematology   Anesthesia Other Findings   Reproductive/Obstetrics                           Anesthesia Physical Anesthesia Plan  ASA: II  Anesthesia Plan: MAC   Post-op Pain Management:    Induction: Intravenous  Airway Management Planned: Nasal Cannula  Additional Equipment:   Intra-op Plan:   Post-operative Plan:   Informed Consent: I have reviewed the patients History and Physical, chart, labs and discussed the procedure including the risks, benefits and alternatives for the proposed anesthesia with the patient or authorized representative who has indicated his/her understanding and acceptance.     Plan Discussed with: CRNA and Surgeon  Anesthesia Plan Comments:         Anesthesia Quick Evaluation

## 2013-11-01 NOTE — Transfer of Care (Signed)
Immediate Anesthesia Transfer of Care Note  Patient: Alexandra Jackson  Procedure(s) Performed: Procedure(s): BILATERAL UPPER BLEPHAROPLASTY (Bilateral)  Patient Location: PACU  Anesthesia Type:MAC  Level of Consciousness: awake and alert   Airway & Oxygen Therapy: Patient Spontanous Breathing and Patient connected to nasal cannula oxygen  Post-op Assessment: Report given to PACU RN and Post -op Vital signs reviewed and stable  Post vital signs: Reviewed and stable  Complications: No apparent anesthesia complications

## 2013-11-01 NOTE — Brief Op Note (Signed)
11/01/2013  2:08 PM  PATIENT:  Dalia HeadingMary A Kurr-Murphy  63 y.o. female  PRE-OPERATIVE DIAGNOSIS:  upper blepharoplasty  POST-OPERATIVE DIAGNOSIS:  upper blepharoplasty  PROCEDURE:  Procedure(s): BILATERAL UPPER BLEPHAROPLASTY (Bilateral)  SURGEON:  Surgeon(s) and Role:    * Etter Sjogrenavid Verina Galeno, MD - Primary  PHYSICIAN ASSISTANT:   ASSISTANTS: none   ANESTHESIA:   IV sedation  EBL:  Total I/O In: 600 [I.V.:600] Out: -   BLOOD ADMINISTERED:none  DRAINS: none   LOCAL MEDICATIONS USED:  XYLOCAINE   SPECIMEN:  No Specimen  DISPOSITION OF SPECIMEN:  N/A  COUNTS:  YES  TOURNIQUET:  * No tourniquets in log *  DICTATION: .Other Dictation: Dictation Number [id number]  PLAN OF CARE: Discharge to home after PACU  PATIENT DISPOSITION:  PACU - hemodynamically stable.   Delay start of Pharmacological VTE agent (>24hrs) due to surgical blood loss or risk of bleeding: not applicable

## 2013-11-01 NOTE — Discharge Instructions (Addendum)
Keep head elevated No lifting or exercising Expect swelling, bruising, and some drainage (should begin to improve Friday) It is OK to shower on Thursday. Pat dry gently and reapply a little vaseline to the sutures. Use artificial tears as needed if eyes are dry Place Lacrilube, or Genteel ointment, or any substitute from the pharmacist when sleeping (this will protect eyes in case swelling at first prevents complete closure while sleeping) Use ice 15 minutes each hour when awake  (a bag of frozen peas works well) Stop using the ice on Thursday For questions call [phone] or [phone] Return to office as scheduled or sooner if problems   Post Anesthesia Home Care Instructions  Activity: Get plenty of rest for the remainder of the day. A responsible adult should stay with you for 24 hours following the procedure.  For the next 24 hours, DO NOT: -Drive a car -Advertising copywriterperate machinery -Drink alcoholic beverages -Take any medication unless instructed by your physician -Make any legal decisions or sign important papers.  Meals: Start with liquid foods such as gelatin or soup. Progress to regular foods as tolerated. Avoid greasy, spicy, heavy foods. If nausea and/or vomiting occur, drink only clear liquids until the nausea and/or vomiting subsides. Call your physician if vomiting continues.  Special Instructions/Symptoms: Your throat may feel dry or sore from the anesthesia or the breathing tube placed in your throat during surgery. If this causes discomfort, gargle with warm salt water. The discomfort should disappear within 24 hours.

## 2013-11-02 NOTE — Op Note (Signed)
NAME:  Alexandra Jackson, Alexandra Jackson      ACCOUNT NO.:  [id number]  MEDICAL RECORD NO.:  [id number]  LOCATION:                               FACILITY:  [facility]  PHYSICIAN:  Etter Sjogrenavid Kimiye Strathman, M.D.     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  PROCEDURE PERFORMED:  Bilateral upper lid blepharoplasty.  SURGEON:  Etter Sjogrenavid Eleni Frank, MD  ANESTHESIA:  MAC sedation with 1% Xylocaine with epinephrine local.  CLINICAL NOTE:  A 63 year old woman complains of excess skin of her upper eyelid that is obstructing her vision.  This was documented on visual field testing by her ophthalmologist.  The visual fields were blocked significantly in the superior visual fields.  She desires upper lid blepharoplasty.  Ashby Dawesature of this procedure, risks versus complications were discussed with her in detail.  Location of scar was also discussed and described to her in detail.  Risks include, but not limited to bleeding, infection, healing problems, scarring, loss of sensation, fluid accumulations, anesthesia related complications, inability to close the eyelids and inability to open the eyelids, damage to muscles, closing and opening the eyelid, as well as muscles tat move the eye, dry eye problems, chronic irritation of the eye, blindness, and overall disappointment, and she understood all this and wished to proceed.  DESCRIPTION OF PROCEDURE:  The patient was marked in the holding area in a full upright position.  She was taken to the operating room and placed supine under satisfactory sedation.  She was prepped with Betadine and draped with sterile drapes.  The incisions were planned as these were located approximately  8 mm above the lid margin and this was measured with a caliper. The amount of skin excision was measured carefully and checked and rechecked by gathering the skin to be excised with a smooth forceps and looking at the lid margin to be certain that the eyes were still closed. The upper markings were placed in such a way as to avoid removal of too much skin.  This was marked and rechecked once again prior to infiltrating local anesthesia.  Local anesthetic was infiltrated using 1% Xylocaine with epinephrine.  The skin excisions were performed. Thorough irrigation with saline.  Meticulous hemostasis with the electrocautery.  Closure with 6-0 Prolene simple running suture and a few reinforcing interrupted 2-0 Prolene sutures.  Vaseline and iced eye pads were then applied and she was transported to the recovery room in stable having tolerated the procedure well.    Etter Sjogrenavid Tymel Conely, M.D.    DB/MEDQ  D:  11/01/2013  T:  11/02/2013  Job:  [id number]

## 2013-11-03 ENCOUNTER — Encounter (HOSPITAL_BASED_OUTPATIENT_CLINIC_OR_DEPARTMENT_OTHER): Payer: Self-pay | Admitting: Plastic Surgery

## 2014-02-22 ENCOUNTER — Ambulatory Visit: Payer: Self-pay | Admitting: Cardiology

## 2014-02-23 ENCOUNTER — Ambulatory Visit (INDEPENDENT_AMBULATORY_CARE_PROVIDER_SITE_OTHER): Payer: 59 | Admitting: Cardiology

## 2014-02-23 VITALS — BP 118/72 | HR 59 | Ht 69.5 in | Wt 154.5 lb

## 2014-02-23 DIAGNOSIS — R002 Palpitations: Secondary | ICD-10-CM

## 2014-02-23 DIAGNOSIS — Z8679 Personal history of other diseases of the circulatory system: Secondary | ICD-10-CM

## 2014-02-23 DIAGNOSIS — IMO0001 Reserved for inherently not codable concepts without codable children: Secondary | ICD-10-CM

## 2014-02-23 NOTE — Patient Instructions (Addendum)
YOU MAY TAKE 1/2 TO 1 TABLET A DAY OF PINDOLOL    Your physician wants you to follow-up in 12 MONTHS DR HARDING--30 MIN APPOINTMENT.  You will receive a reminder letter in the mail two months in advance. If you don't receive a letter, please call our office to schedule the follow-up appointment.

## 2014-02-27 ENCOUNTER — Encounter: Payer: Self-pay | Admitting: Cardiology

## 2014-02-27 DIAGNOSIS — R002 Palpitations: Secondary | ICD-10-CM | POA: Insufficient documentation

## 2014-02-27 MED ORDER — DIGOXIN 125 MCG PO TABS: 0.1250 mg | ORAL_TABLET | Freq: Every day | ORAL | Status: DC

## 2014-02-27 MED ORDER — PINDOLOL 5 MG PO TABS: ORAL_TABLET | ORAL | Status: DC

## 2014-02-27 NOTE — Assessment & Plan Note (Signed)
With this condition she has multiple different symptoms of atypical pinching chest pains.  She did not mention much of that today, but has been an issue in the past. She mentioned more of her "messed up spine ".

## 2014-02-27 NOTE — Assessment & Plan Note (Signed)
On atenolol and digoxin. I just ordered she can do an additional one dose of pindolol if she has frequent episodes.

## 2014-02-27 NOTE — Progress Notes (Signed)
PATIENT: Alexandra Jackson MRN: [id number]  DOB: January 08, 1951   DOV:02/27/2014 PCP: Juline Patch, MD  Clinic Note: Chief Complaint  Patient presents with  . Follow-up    1 year follow-up, pt c/o tiredness and dizziness some irregular heart beat and aching in chest.    HPI: Alexandra Jackson is a 63 y.o.  female with a PMH below who presents today for annual followup. I last saw her in May of 2014 to followup atypical chest discomfort and dyspnea. Prior cardiac evaluations include:  Echocardiogram in 2006 which was essentially normal with exception of septal hypokinesis and EF of 50%.  Cardiac catheterization in 2010 following an episode of nonsustained V. tach that showed no evidence of significant CAD.   CPET Excess Tolerance Test that was really unhelpful so she didn't even come close to her target exercise level.   LexiScan Myoview in January 2014 which is low risk of no ischemia or infarction in EF 71%. The fascia  I didn't make any changes last time I saw her. She takes a 2.5 mg of pindolol for blood pressure/palpitations.  Interval History: After several minutes of talking about things and do not seem to be pertinent, she does note having occasional palpitations but not prolonged and in much less frequent than before. She noticed a bit more over the past few months because she's been under a lot more stress. She is due to retire in September. There is some big event for them to happen) a time before she retires and she is under a lot of stress trying to get things worked out. She says the palpitation episodes will last for 30 seconds, this was concerning to her. She denies any lightheadedness or dizziness associated with them. No significant near-syncope no TIA or amaurosis fugax symptoms.  She says that she does feel generally tired and fatigued/stressed out. She occasionally has dizzy spells that are more positional before turning her head in certain directions the No chest pain or  shortness of breath with rest or exertion. No PND, orthopnea or edema.  No melena, hematochezia, hematuria, or epstaxis. No claudication.  Past Medical History  Diagnosis Date  . Atrial fibrillation     reported -   . Fibromyalgia   . Degenerative disc disease   . Chronic chest pain     has had several cardiac tests negative including cardiac cath in 2010, Myoview in 10/2012  . Bilateral leg cramps   . Wears glasses   . Dental crown present   . Snores    Prior Cardiac Evaluation and Past Surgical History: Reviewed in EPIC.   Allergies  Allergen Reactions  . Metoprolol Tartrate     REACTION: dizzy, diaphoretic, and weak    Current Outpatient Prescriptions  Medication Sig Dispense Refill  . aspirin 325 MG tablet Take 325 mg by mouth daily.      . cholecalciferol (VITAMIN D) 1000 UNITS tablet Take 1,000 Units by mouth daily. 5000u      . digoxin (LANOXIN) 0.125 MG tablet Take 1 tablet (0.125 mg total) by mouth daily.  90 tablet  3  . magnesium 30 MG tablet Take 30 mg by mouth 2 (two) times daily.      . Omega 3 1000 MG CAPS Take 1 capsule by mouth daily.      . pindolol (VISKEN) 5 MG tablet MAY TAKE 1/2 T0 1 TABLET  A DAILY AS NEEDED  90 tablet  3  . potassium chloride (K-DUR,KLOR-CON) 10 MEQ tablet Take  10 mEq by mouth 2 (two) times daily.      . pramipexole (MIRAPEX) 0.5 MG tablet Take 1 tablet by mouth at bedtime.      . vitamin B-12 (CYANOCOBALAMIN) 1000 MCG tablet Take 1,000 mcg by mouth daily.       No current facility-administered medications for this visit.    History   Social History Narrative   Married mother of one. Does not get routine exercise.   Quit smoking in 1989. Drinks occasional glass of peanut lesion to to help her relax at night.   Drinks gluten free beers as well.   She is on a gluten-free diet.   ROS: A comprehensive Review of Systems - Negative except Symptoms above and significant stress  PHYSICAL EXAM BP 118/72  Pulse 59  Ht 5' 9.5" (1.765  m)  Wt 154 lb 8 oz (70.081 kg)  BMI 22.50 kg/m2 General appearance: alert, cooperative, appears stated age, no distress and Well-nourished and well-groomed. Very talkative and somewhat tangential. Neck: no adenopathy, no carotid bruit, no JVD, supple, symmetrical, trachea midline and thyroid not enlarged, symmetric, no tenderness/mass/nodules Lungs: clear to auscultation bilaterally, normal percussion bilaterally and Nonlabored, good air movement Heart: regular rate and rhythm, S1, S2 normal, no murmur, click, rub or gallop and normal apical impulse Abdomen: soft, non-tender; bowel sounds normal; no masses,  no organomegaly Extremities: extremities normal, atraumatic, no cyanosis or edema Pulses: 2+ and symmetric Neurologic: Grossly normal   Adult ECG Report  Rate: 59 ;  Rhythm: sinus bradycardia ; otherwise normal EKG  Recent Labs: None  ASSESSMENT / PLAN:  ATRIAL FIBRILLATION, PAROXYSMAL, HX OF I do not recall seeing any history of A. fib that has been documented. She does have palpitations. She continues to be on pindolol and digoxin. She also takes a full dose aspirin.  Intermittent palpitations On atenolol and digoxin. I just ordered she can do an additional one dose of pindolol if she has frequent episodes.  FIBROMYALGIA With this condition she has multiple different symptoms of atypical pinching chest pains.  She did not mention much of that today, but has been an issue in the past. She mentioned more of her "messed up spine ".    Orders Placed This Encounter  Procedures  . EKG 12-Lead    Despite the lack of major decision making any major medical issues, simply trying to get answers to questions took a significant amount of time. I spent at least 25 minutes with the patient.  Followup: One year  Orlie Cundari W. Herbie BaltimoreHARDING, M.D., M.S. Interventional Cardiology [facility]

## 2014-02-27 NOTE — Assessment & Plan Note (Signed)
I do not recall seeing any history of A. fib that has been documented. She does have palpitations. She continues to be on pindolol and digoxin. She also takes a full dose aspirin.

## 2014-04-04 ENCOUNTER — Telehealth: Payer: Self-pay | Admitting: *Deleted

## 2014-04-04 NOTE — Telephone Encounter (Signed)
PATIENT LEFT WALK-IN FORM STATING SHE NEED PRESCRIPTION BY 04/24/2014 AND NEEDED REFILL.  CALLED PHARMACY - SPOKE TO ANDREW PER ANDREW , MEDICATION IS ON FILE -- PINDOLOL AND DIGOXIN. PINDOLOL WAS PROCESS AND CAN BE PICKED UP  NOW , DIGOXIN CAN BE PROCESSED AND PICKED UP ON 04/07/14.  RN CALLED LEFT A MESSAGE FOR PATIENT TO CALL BACK.

## 2014-04-05 ENCOUNTER — Telehealth: Payer: Self-pay | Admitting: *Deleted

## 2014-04-05 NOTE — Telephone Encounter (Signed)
SPOKE TO PATIENT . SHE IS AWARE OF THE PRESCRIPTION BEING AVAILABLE.

## 2014-07-17 ENCOUNTER — Other Ambulatory Visit: Payer: Self-pay | Admitting: Internal Medicine

## 2014-07-17 DIAGNOSIS — R413 Other amnesia: Secondary | ICD-10-CM

## 2014-07-18 ENCOUNTER — Ambulatory Visit
Admission: RE | Admit: 2014-07-18 | Discharge: 2014-07-18 | Disposition: A | Discharge status: Home / Self Care | Payer: Medicare Other | Source: Ambulatory Visit | Attending: Internal Medicine | Admitting: Internal Medicine

## 2014-07-18 DIAGNOSIS — R413 Other amnesia: Secondary | ICD-10-CM

## 2014-08-14 ENCOUNTER — Encounter: Payer: Self-pay | Admitting: Cardiology

## 2014-08-21 ENCOUNTER — Telehealth: Payer: Self-pay | Admitting: *Deleted

## 2014-08-21 NOTE — Telephone Encounter (Signed)
Spoke to patient. Result given . Verbalized understanding Patient is aware. She states she is now retired. She will she pcp in DEC 2015. Discuss diet intake - cold water fish , decrease starches

## 2014-08-21 NOTE — Telephone Encounter (Signed)
-----   Message from [name], MD sent at 08/15/2014  6:43 PM EST ----- Labs are pretty normal.  Lipids are a bit abnormal though - TC 224 & LDL 123 - both high.  Will defer to PCP, but if DIet & exercise not helpul, may need medical therapy.  Marykay LexHARDING,DAVID W, MD

## 2015-02-05 ENCOUNTER — Encounter: Payer: Self-pay | Admitting: *Deleted

## 2015-02-21 ENCOUNTER — Encounter: Payer: Self-pay | Admitting: Cardiology

## 2015-02-21 ENCOUNTER — Ambulatory Visit (INDEPENDENT_AMBULATORY_CARE_PROVIDER_SITE_OTHER): Payer: Medicare Other | Admitting: Cardiology

## 2015-02-21 VITALS — BP 90/60 | HR 69 | Ht 69.0 in | Wt 131.8 lb

## 2015-02-21 DIAGNOSIS — R002 Palpitations: Secondary | ICD-10-CM

## 2015-02-21 DIAGNOSIS — M609 Myositis, unspecified: Secondary | ICD-10-CM | POA: Diagnosis not present

## 2015-02-21 DIAGNOSIS — IMO0001 Reserved for inherently not codable concepts without codable children: Secondary | ICD-10-CM

## 2015-02-21 DIAGNOSIS — R5383 Other fatigue: Secondary | ICD-10-CM | POA: Diagnosis not present

## 2015-02-21 DIAGNOSIS — M791 Myalgia: Secondary | ICD-10-CM | POA: Diagnosis not present

## 2015-02-21 NOTE — Progress Notes (Signed)
PATIENT: Alexandra Jackson MRN: [id number]  DOB: 1951-10-01   DOV:02/23/2015 PCP: Juline PatchPANG,RICHARD, MD  Clinic Note: Chief Complaint  Patient presents with  . Annual Exam    patient reports having chest discomfort  and SOB since last visit. will have symptoms daily.    HPI: Alexandra Jackson is a 64 y.o.  female with a PMH below who presents today for annual followup. I last saw her in May of 2014 to followup atypical chest discomfort and dyspnea. Prior cardiac evaluations include:  Echocardiogram in 2006 which was essentially normal with exception of septal hypokinesis and EF of 50%.  Cardiac catheterization in 2010 following an episode of nonsustained V. tach that showed no evidence of significant CAD.   CPET Excess Tolerance Test that was really unhelpful so she didn't even come close to her target exercise level.   LexiScan Myoview January 2014: low risk of no ischemia or infarction in EF 71%.  I didn't make any changes last time I saw her. She takes a 2.5 mg of pindolol for blood pressure/palpitations.  Interval History: Overall she is stable cardiac standpoint. His usual redirection is the norm for this visit. She still notices her intermittent palpitations.  Sometimes they're very brief intermittent others they can last up to 2 minutes where she usually feels irregular or skipped beats. Most the time the low-dose pindolol has stays controlled, sometimes she'll have more frequent episodes. Only rarely does she take the additional half dose. The most notable symptom she complains of is just feeling tired and fatigued. She has some discomfort sensations in her chest but twinges but more likely associated with her palpitations.  Cardiovascular ROS: positive for - irregular heartbeat, palpitations and Atypical pinching chest pain negative for - dyspnea on exertion, edema, loss of consciousness, orthopnea, paroxysmal nocturnal dyspnea, rapid heart rate or shortness of breath;  TIA/amaurosis fugax, syncope/near syncope  She occasionally has dizzy spells that are more positional before turning her head in certain directions the No claudication.  Past Medical History  Diagnosis Date  . Atrial fibrillation     reported -   . Fibromyalgia   . Degenerative disc disease   . Chronic chest pain     has had several cardiac tests negative including cardiac cath in 2010, Myoview in 10/2012  . Bilateral leg cramps   . Wears glasses   . Dental crown present   . Snores    Prior Cardiac Evaluation and Past Surgical History: Reviewed in EPIC.   Allergies  Allergen Reactions  . Metoprolol Tartrate     REACTION: dizzy, diaphoretic, and weak    Current Outpatient Prescriptions  Medication Sig Dispense Refill  . aspirin 325 MG tablet Take 325 mg by mouth daily.    . cholecalciferol (VITAMIN D) 1000 UNITS tablet Take 1,000 Units by mouth daily. 5000u    . digoxin (LANOXIN) 0.125 MG tablet Take 1 tablet (0.125 mg total) by mouth daily. 90 tablet 3  . divalproex (DEPAKOTE ER) 500 MG 24 hr tablet Take 1 tablet by mouth daily.  4  . magnesium 30 MG tablet Take 30 mg by mouth daily.     Marland Kitchen. MELATONIN PO Take by mouth at bedtime.    . Omega 3 1000 MG CAPS Take 1 capsule by mouth daily. Krill oil    . pindolol (VISKEN) 5 MG tablet MAY TAKE 1/2 T0 1 TABLET  A DAILY AS NEEDED 90 tablet 3  . vitamin B-12 (CYANOCOBALAMIN) 1000 MCG tablet Take 1,000 mcg  by mouth daily.     No current facility-administered medications for this visit.    History   Social History Narrative   Married mother of one. Does not get routine exercise.   Quit smoking in 1989. Drinks occasional glass of peanut lesion to to help her relax at night.   Drinks gluten free beers as well.   She is on a gluten-free diet.   ROS: A comprehensive Review of Systems - was performed Crazy, cranky & stupid - giving in to depressed thoughts (now that retired in Sept & just turned 64) Review of Systems    Constitutional: Positive for malaise/fatigue (Lack of desire to do things; she often feels tired during the day).  Respiratory: Negative.   Cardiovascular: Positive for palpitations (as per history of present illness). Negative for claudication and leg swelling.  Gastrointestinal: Negative for blood in stool and melena.  Genitourinary: Negative for hematuria.  Neurological: Negative for headaches.  Psychiatric/Behavioral: Positive for depression. The patient is nervous/anxious.        Seems more than usually depressed herself with having recently retired. She is frustrated because now with only her husband the breadwinner, there annual salary is notably decreased. She complains about how much of her Social Security income is going to taxes  All other systems reviewed and are negative.   PHYSICAL EXAM BP 90/60 mmHg  Pulse 69  Ht  (1.753 m)  Wt 59.784 kg (131 lb 12.8 oz)  BMI 19.45 kg/m2 General appearance: alert, cooperative, appears stated age, no distress and Well-nourished and well-groomed. Very talkative and somewhat tangential. Neck: no adenopathy, no carotid bruit, no JVD, supple, symmetrical, trachea midline and thyroid not enlarged, symmetric, no tenderness/mass/nodules Lungs: clear to auscultation bilaterally, normal percussion bilaterally and Nonlabored, good air movement Heart: regular rate and rhythm, S1, S2 normal, no murmur, click, rub or gallop and normal apical impulse Abdomen: soft, non-tender; bowel sounds normal; no masses,  no organomegaly Extremities: extremities normal, atraumatic, no cyanosis or edema Pulses: 2+ and symmetric Neurologic: Grossly normal   Adult ECG Report  Rate: 69 ;  Rhythm: normal sinus rhythm ; otherwise normal EKG  Recent Labs: None  ASSESSMENT / PLAN: Problem List Items Addressed This Visit    Fatigue due to treatment (Chronic)    To try to manage of the potential fatigue effect of beta blocker, we will switch to the p.m. dose. This  will hopefully help her get to sleep as opposed to keeping her groggy during the day.      Relevant Orders   EKG 12-Lead   Intermittent palpitations - Primary (Chronic)    Well-controlled on pindolol and digoxin. She did not do well with atenolol. I think with her having some fatigue we will switch her to take her standing dose of pindolol in the evening with only a when necessary dose during the day if necessary.      Relevant Orders   EKG 12-Lead   Myalgia and myositis    She carries a diagnosis of fibromyalgia. I think this is probably related to discomfort and twinging sensations component. This also may be related to her fatigue as well.      Relevant Orders   EKG 12-Lead      Orders Placed This Encounter  Procedures  . EKG 12-Lead    Despite the lack of major decision making any major medical issues, simply trying to get answers to questions took a significant amount of time. I spent at least 25 minutes  with the patient.  Followup: One year  DAVID W. Herbie BaltimoreHARDING, M.D., M.S. Interventional Cardiology [facility]

## 2015-02-21 NOTE — Patient Instructions (Signed)
Your physician has recommended you make the following change in your medication: change the pindolol to night time instead of morning.   Your physician wants you to follow-up in: 1 year with Dr. Herbie BaltimoreHarding. You will receive a reminder letter in the mail two months in advance. If you don't receive a letter, please call our office to schedule the follow-up appointment.

## 2015-02-23 ENCOUNTER — Encounter: Payer: Self-pay | Admitting: Cardiology

## 2015-02-23 DIAGNOSIS — R5383 Other fatigue: Secondary | ICD-10-CM | POA: Insufficient documentation

## 2015-02-23 NOTE — Assessment & Plan Note (Signed)
Well-controlled on pindolol and digoxin. She did not do well with atenolol. I think with her having some fatigue we will switch her to take her standing dose of pindolol in the evening with only a when necessary dose during the day if necessary.

## 2015-02-23 NOTE — Assessment & Plan Note (Signed)
To try to manage of the potential fatigue effect of beta blocker, we will switch to the p.m. dose. This will hopefully help her get to sleep as opposed to keeping her groggy during the day.

## 2015-02-23 NOTE — Assessment & Plan Note (Signed)
She carries a diagnosis of fibromyalgia. I think this is probably related to discomfort and twinging sensations component. This also may be related to her fatigue as well.

## 2015-03-23 ENCOUNTER — Telehealth: Payer: Self-pay | Admitting: *Deleted

## 2015-03-23 MED ORDER — PINDOLOL 5 MG PO TABS: ORAL_TABLET | ORAL | Status: DC

## 2015-03-23 MED ORDER — DIGOXIN 125 MCG PO TABS: 0.1250 mg | ORAL_TABLET | Freq: Every day | ORAL | Status: DC

## 2015-03-23 NOTE — Telephone Encounter (Signed)
Mrs. Is returning a call from a few minutes ago.

## 2015-03-23 NOTE — Telephone Encounter (Signed)
Pt submitted walk-in request for refills of Digoxin & Pindolol to be sent to her preferred home pharmacy.  This request was submitted - I also called patient, got VM - left non-PHI general message to return call.

## 2015-03-23 NOTE — Telephone Encounter (Signed)
Called patient, informed refill available at her preferred pharmacy, she verbalized understanding, stated appreciation.

## 2015-03-28 ENCOUNTER — Encounter: Payer: Self-pay | Admitting: Cardiology

## 2015-04-02 ENCOUNTER — Encounter: Payer: Self-pay | Admitting: Cardiology

## 2015-04-02 NOTE — Progress Notes (Signed)
Quick Note:  Recent Labs: 03/21/2015 - From PCP Na+ 142, K+ 4.9, Cl- 104, HCO3- 32 , BUN 10, Cr 0.9, Glu 90, Ca2+ 9.3; AST 17, ALT 26 AlkP 52, Alb 3.2, TP 6.7, T Bili 0.5 CBC: W 3.7, H/H 14.4/44.3, Plt 184 TC 229, TG 88, HDL 82, LDL 129   HARDING, DAVID W, MD   ______

## 2015-04-03 ENCOUNTER — Telehealth: Payer: Self-pay | Admitting: *Deleted

## 2015-04-03 NOTE — Telephone Encounter (Signed)
Spoke to patient.  [facility] . Verbalized understanding NO CHANGES AT CURRENT TIME

## 2015-04-03 NOTE — Telephone Encounter (Signed)
-----   Message from [name], MD sent at 04/02/2015  3:36 PM EDT ----- Recent Labs: 03/21/2015 - From PCP Na+ 142, K+ 4.9, Cl- 104, HCO3- 32 , BUN 10, Cr 0.9, Glu 90, Ca2+ 9.3; AST 17, ALT 26 AlkP 52, Alb 3.2, TP 6.7, T Bili 0.5 CBC: W 3.7, H/H 14.4/44.3, Plt 184 TC 229, TG 88, HDL 82, LDL 129   HARDING, [name], MD

## 2015-09-24 ENCOUNTER — Encounter: Payer: Self-pay | Admitting: Neurology

## 2015-09-24 ENCOUNTER — Ambulatory Visit (INDEPENDENT_AMBULATORY_CARE_PROVIDER_SITE_OTHER): Payer: Medicare Other | Admitting: Neurology

## 2015-09-24 VITALS — BP 102/70 | HR 72 | Ht 70.0 in | Wt 130.0 lb

## 2015-09-24 DIAGNOSIS — E538 Deficiency of other specified B group vitamins: Secondary | ICD-10-CM

## 2015-09-24 DIAGNOSIS — R413 Other amnesia: Secondary | ICD-10-CM

## 2015-09-24 DIAGNOSIS — F22 Delusional disorders: Secondary | ICD-10-CM | POA: Diagnosis not present

## 2015-09-24 HISTORY — DX: Delusional disorders: F22

## 2015-09-24 HISTORY — DX: Other amnesia: R41.3

## 2015-09-24 NOTE — Progress Notes (Deleted)
Subjective:    Patient ID: Alexandra Jackson is a 64 y.o. female.  HPI {Common ambulatory SmartLinks:[id number]}  Review of Systems  Objective:  Neurologic Exam  Physical Exam  Assessment:   ***  Plan:   ***

## 2015-09-24 NOTE — Progress Notes (Signed)
Reason for visit:  Memory disturbance  Referring physician:  Dr. Rod Holler Alexandra Jackson is a 64 y.o. female  History of present illness:   Alexandra Jackson is a 64 year old right-handed white female with a history of some issues with memory dating back at least 2-3 years. The patient comes to the office by herself, she provides the history. She indicates that she has had a lot of issues with word finding problems and remembering names for people and things. She has had some significant issues with depression. The patient indicates that she has difficulty recognizing faces and names for people and places and things. She has short-term memory issues, she cannot recall recent conversations or events. She indicates that she does not have troubles with driving or with directions. She may have some issues keeping up with medications and appointments. When asked about how she does with finances, she demonstrates significant issues with delusional thinking, she indicates that companies that sent her bills for things "want her to die", and she offers unusual views on what motivates [employer] [place]. The patient retired at age 24, she felt that she was not able to perform her job well because of memory. She does note some issues with balance, she does have some occasional episodes of dizziness. She reports some occasional numbness in the arms and legs, and occasional leg cramps. She has had some mild changes in bladder control. She reports that her appetite is decreased. She has undergone a CT scan of the brain that was done on 07/18/2014, I have reviewed this online and it appears to be unremarkable. She is sent to this office for an evaluation.    Past Medical History  Diagnosis Date  . Atrial fibrillation (HCC)     reported -   . Fibromyalgia   . Degenerative disc disease   . Chronic chest pain     has had several cardiac tests negative including cardiac cath in 2010, Myoview in 10/2012  .  Bilateral leg cramps   . Dental crown present   . Snores   . Memory difficulties 09/24/2015  . Delusional disorder (HCC) 09/24/2015    Past Surgical History  Procedure Laterality Date  . Cardiac catheterization  2010    No signficant CAD  . Tubal ligation    . Dilation and curettage of uterus    . Colonoscopy    . Tonsillectomy    . Orif wrist fracture  2003    left  . Hardware removal  2004    left wrist  . Brow lift Bilateral 11/01/2013    Procedure: BILATERAL UPPER BLEPHAROPLASTY;  Surgeon: Etter Sjogren, MD;  Location: [facility];  Service: Plastics;  Laterality: Bilateral;    Family History  Problem Relation Age of Onset  . Dementia Mother     Social history:  reports that she has never smoked. She does not have any smokeless tobacco history on file. She reports that she drinks alcohol. She reports that she does not use illicit drugs.  Medications:  Prior to Admission medications   Medication Sig Start Date End Date Taking? Authorizing Provider  aspirin 325 MG tablet Take 325 mg by mouth daily.   Yes Historical Provider, MD  cholecalciferol (VITAMIN D) 1000 UNITS tablet Take 1,000 Units by mouth daily. 5000u   Yes Historical Provider, MD  digoxin (LANOXIN) 0.125 MG tablet Take 1 tablet (0.125 mg total) by mouth daily. 03/23/15  Yes Marykay Lex, MD  divalproex (DEPAKOTE  ER) 500 MG 24 hr tablet Take 1 tablet by mouth daily. 01/27/15  Yes Historical Provider, MD  gabapentin (NEURONTIN) 300 MG capsule Take one pill daily 09/21/15  Yes Historical Provider, MD  magnesium 30 MG tablet Take 30 mg by mouth daily.    Yes Historical Provider, MD  MELATONIN PO Take by mouth at bedtime.   Yes Historical Provider, MD  Omega 3 1000 MG CAPS Take 1 capsule by mouth daily. Krill oil   Yes Historical Provider, MD  pindolol (VISKEN) 5 MG tablet MAY TAKE 1/2 T0 1 TABLET  A DAILY AS NEEDED 03/23/15  Yes Marykay Lex, MD  vitamin B-12 (CYANOCOBALAMIN) 1000 MCG tablet Take  1,000 mcg by mouth daily.   Yes Historical Provider, MD      Allergies  Allergen Reactions  . Metoprolol Tartrate     REACTION: dizzy, diaphoretic, and weak    ROS:  Out of a complete 14 system review of symptoms, the patient complains only of the following symptoms, and all other reviewed systems are negative.   Memory disturbance  Blood pressure 102/70, pulse 72, height  (1.778 m), weight 130 lb (58.968 kg).  Physical Exam  General: The patient is alert and cooperative at the time of the examination.  Eyes: Pupils are equal, round, and reactive to light. Discs are flat bilaterally.  Neck: The neck is supple, no carotid bruits are noted.  Respiratory: The respiratory examination is clear.  Cardiovascular: The cardiovascular examination reveals a regular rate and rhythm, no obvious murmurs or rubs are noted.  Skin: Extremities are without significant edema.  Neurologic Exam  Mental status: The patient is alert and oriented x 3 at the time of the examination. The patient has apparent normal recent and remote memory, with an apparently normal attention span and concentration ability.  Mini-Mental status examination done today shows a total score 29/30. The patient is able to name 13 four Legged animals in 30 seconds.  Cranial nerves: Facial symmetry is present. There is good sensation of the face to pinprick and soft touch bilaterally. The strength of the facial muscles and the muscles to head turning and shoulder shrug are normal bilaterally. Speech is well enunciated, no aphasia or dysarthria is noted. Extraocular movements are full. Visual fields are full. The tongue is midline, and the patient has symmetric elevation of the soft palate. No obvious hearing deficits are noted.  Motor: The motor testing reveals 5 over 5 strength of all 4 extremities. Good symmetric motor tone is noted throughout.  Sensory: Sensory testing is intact to pinprick, soft touch, vibration  sensation, and position sense on all 4 extremities. No evidence of extinction is noted.  Coordination: Cerebellar testing reveals good finger-nose-finger and heel-to-shin bilaterally.  Gait and station: Gait is normal. Tandem gait is normal. Romberg is negative. No drift is seen.  Reflexes: Deep tendon reflexes are symmetric and normal bilaterally. Toes are downgoing bilaterally.   Assessment/Plan:   1. Memory disturbance   2. Delusional thinking disorder   3. Depression   The patient has a very unusual affect. She has significant issues with delusional thinking. She reports some memory issues over the last 2-3 years. The patient may have a primary degenerative process and dementia, possibly a frontotemporal dementia. The patient will be set up for MRI of the brain, she will have blood work today, and she will have formal neuropsychological evaluation. She will follow-up in 3 months. May consider medications for memory at that time.  Marlan Palau. Keith Harolyn Cocker MD 09/24/2015 8:36 PM  [facility] [address] [place] [id number]  Phone [phone] Fax [phone]

## 2015-09-24 NOTE — Patient Instructions (Signed)
    We will check MRI of the brain and some blood work. We will get a neuropsychological test done.

## 2015-09-25 LAB — HIV ANTIBODY (ROUTINE TESTING W REFLEX): HIV Screen 4th Generation wRfx: NONREACTIVE

## 2015-09-25 LAB — AMMONIA: AMMONIA: 47 ug/dL (ref 19–87)

## 2015-09-25 LAB — SEDIMENTATION RATE: SED RATE: 2 mm/h (ref 0–40)

## 2015-09-25 LAB — VITAMIN B12: Vitamin B-12: 1655 pg/mL — ABNORMAL HIGH (ref 211–946)

## 2015-09-25 LAB — ANA W/REFLEX: Anti Nuclear Antibody(ANA): NEGATIVE

## 2015-09-25 LAB — COPPER, SERUM: COPPER: 95 ug/dL (ref 72–166)

## 2015-09-25 LAB — RPR: RPR Ser Ql: NONREACTIVE

## 2015-11-05 ENCOUNTER — Telehealth: Payer: Self-pay | Admitting: Neurology

## 2015-11-05 ENCOUNTER — Ambulatory Visit
Admission: RE | Admit: 2015-11-05 | Discharge: 2015-11-05 | Disposition: A | Discharge status: Home / Self Care | Payer: Medicare Other | Source: Ambulatory Visit | Attending: Neurology | Admitting: Neurology

## 2015-11-05 DIAGNOSIS — R413 Other amnesia: Secondary | ICD-10-CM

## 2015-11-05 DIAGNOSIS — E538 Deficiency of other specified B group vitamins: Secondary | ICD-10-CM

## 2015-11-05 NOTE — Telephone Encounter (Signed)
  I called patient. MRI the brain shows minimal atrophy, very minimal white matter changes. The patient is to go for formal neuropsychological evaluation, I will try to contact her after that study.  MRI brain 11/05/15:  IMPRESSION: This MRI of the brain without contrast shows the following: 1. Mild cortical atrophy, essentially unchanged when compared to CT scan dated 07/18/2014. The temporal horn of the lateral ventricle on the right is larger than the temporal horn on the left, also seen on CT scan from 2015. 2. Few scattered T2/FLAIR hyperintense foci in the subcortical deep white matter consistent with minimal chronic microvascular ischemic change. The extent is typical for age. 3. There are no acute findings.

## 2015-11-08 ENCOUNTER — Other Ambulatory Visit (INDEPENDENT_AMBULATORY_CARE_PROVIDER_SITE_OTHER): Payer: Self-pay | Admitting: Otolaryngology

## 2015-11-08 DIAGNOSIS — R1312 Dysphagia, oropharyngeal phase: Secondary | ICD-10-CM

## 2015-11-16 ENCOUNTER — Ambulatory Visit (HOSPITAL_COMMUNITY)
Admission: RE | Admit: 2015-11-16 | Discharge: 2015-11-16 | Disposition: A | Discharge status: Home / Self Care | Payer: Medicare Other | Source: Ambulatory Visit | Attending: Otolaryngology | Admitting: Otolaryngology

## 2015-11-16 DIAGNOSIS — R1312 Dysphagia, oropharyngeal phase: Secondary | ICD-10-CM | POA: Diagnosis present

## 2015-12-11 ENCOUNTER — Ambulatory Visit: Payer: Medicare Other | Admitting: Psychology

## 2015-12-14 ENCOUNTER — Ambulatory Visit: Payer: Medicare Other | Attending: Psychology | Admitting: Psychology

## 2015-12-14 DIAGNOSIS — F22 Delusional disorders: Secondary | ICD-10-CM | POA: Diagnosis not present

## 2015-12-14 DIAGNOSIS — F339 Major depressive disorder, recurrent, unspecified: Secondary | ICD-10-CM | POA: Insufficient documentation

## 2015-12-14 DIAGNOSIS — R488 Other symbolic dysfunctions: Secondary | ICD-10-CM | POA: Insufficient documentation

## 2015-12-14 DIAGNOSIS — R413 Other amnesia: Secondary | ICD-10-CM

## 2015-12-14 NOTE — Progress Notes (Addendum)
[facility]  [address]   Telephone [phone] [address] Fax [phone] [place], [place] [address]   NEUROPSYCHOLOGICAL EVALUATION  *CONFIDENTIAL* This report should not be released without the consent of the client  Name:   Alexandra Jackson  Date of Birth:  [birth year] [facility] MR#:  [id number] Date of Evaluation: 12/14/15  Reason for Referral Alexandra Jackson is a 65 year-old right-handed woman who was referred for neuropsychological evaluation by C. Lesia Sago, MD of [facility]. This referral was prompted by her report of experiencing memory and word finding difficulties over the past two to three years. She also complained of depression. It was noted that she presented with unusual affect and delusional thoughts. An MRI of the brain on 11/05/15 showed mild cortical atrophy, the right temporal horn larger than the left, and minimal chronic white matter ischemic disease deemed typical for her age.  Sources of Information Electronic medical records from the [facility] were reviewed. Alexandra Jackson was interviewed. Her husband, Mr. Elvina Mattes, was interviewed by phone on 12/20/15.   Patient Report Alexandra Jackson reported that for the past two years she has experienced problems with memory and word-finding. She gave recent examples of not recalling the names of acquaintances or neighbors and "semi-recognizing" their faces, forgetting recent conversations, not being able to find words while speaking and misplacing objects. She denied any problems with using household appliances, taking her medications on schedule or with driving.   Throughout the interview, she was prone to launch into brief rants without warning using nonsensical rhyming words to express her negative feelings about being mistreated by the insurance industry and the medical profession. For example, she stated that her insurance company has sent her  "Crissie Figures letters to screwey wooey [place]". At one point she stated, "It's the toxic testosterone from the [employer]) CEO's penis that poisons [place]". She did not agree that she might be having delusional thoughts but added that "nobody agrees with me". She did not express any unusual ideas that were not of a persecutory nature.   In addition to cognitive problems, she cited lowered appetite within the past year with almost exclusive eating of a "snackey thing" she created consisting of nut, cheese and bacon, leg cramps since adolescence that have worsened within the past five years, pain in her lower back and neck, and depressed mood. She reported feeling depressed marked by persistent sad mood, loss of interests, fluctuating appetite and passive suicidal ideation without plan ("I sometimes hope that I go to sleep and don't wake up"), the latter usually triggered by worries about her financial future. She reported that she has been experiencing passive wishes to die off and on for the past twenty years. She denied experiencing apathy or indifference, periods of elation or euphoria, racing thoughts, anxiety attacks, irritability, repetitive behaviors (other than sometimes tapping her fingers to make a tune), problems with impulse control, homicidal thoughts or hallucinations.  Collateral Information Her husband reported that over the past two years his wife has demonstrated a decline in her abilities to find words while speaking and to remember names of people. She began using nonsense words about the same time. He has not observed her to be forgetful as she has seemed able to recall recent conversations, does not misplace objects and remembers most all of her appointments. With regards to her mood and behavior, he described her as prone to become easily irritated or anxious. Her moodiness has actually diminished  over the past two years since she began taking a medication "to level her mood" (divalproex?)  as prescribed by her primary care physician. He reported that for up to ten years prior to starting on that medication she had displayed "severe mood swings" characterized by rapid shifts from anger to sadness to anxiety. He has not observed any major changes in her personality, habits or interests within the past two years. He described her social comportment around friends and strangers as acceptable except that she can become quite argumentative when engaged in a political discussion. He stated his belief that she has lately been depressed over changes in her appearance due to aging.  Background She lives with her husband of twenty-eight years. They have a 53 year old daughter. She described her husband and daughter as supportive.  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  She reported that she earned a BA in General Studies from [employer] and later a Master's degree in [employer] and Dance in 1980 from [employer]. She denied history of school-based attentional or learning problems.   Her past medical history was notable for atrial fibrillation, degenerative disc disease and Fibromyalgia. Her current medications included aspirin, digoxin, divalproex, gabapentin, pindolol as needed and Vitamin B-12 and D supplements  She did not report history of unusual childhood illnesses or developmental delays. She reported no history of head injury, loss of consciousness, seizure activity, stroke-like symptoms or exposure to toxic chemicals. She reported rare alcohol use. She reported that she has never used illicit drugs or tobacco products.  She has a family history of dementia in her mother. She was not aware of any family history of  psychiatric disorders.  With regards to her mental health history, she reported a longtime history of depression. She reported that she has been meeting with a psychological counselor at [facility] since the summer of 2016 for treatment of depression. She reported no history of any other mental health contacts. She stated her belief that she took an antidepressant medication in the past but was not sure whether it was prescribed for her mood or fibromyalgia. She reported no history of suicidal behavior, mood instability or hallucinations.  Observations Upon her arrival to the clinic she began to loudly criticize the medical profession for no apparent reason. She appeared as an appropriately dressed and groomed woman in no apparent physical distress. Observations of this pleasant woman were notable for her launching into brief rants without warning characterized by her belief that she has been mistreated by insurance companies and corporations. During these rants she used nonsensical rhyming words (e.g., "willy wocky flackey wacky stupid things"). Otherwise, her speech was well-articulated, fluent, spontaneous and productive without signs of echolalia or perseveration. When appearing calm, she did not exhibit problems with word selection, message coherence or language comprehension. Her affect appeared within a wide range and was labile. Her baseline mood seemed mildly depressed. She was oriented in all spheres. She was able to recall details of what she did the previous day. She seemed to be a reliable informant. Her thought processes were coherent and organized though easily derailed to persecutory themes. She did not express any other types of delusions or bizarre ideas.   Evaluation Procedures Animal Naming Test  Boston Naming Test Brief Symptom Inventory Clock Drawing Test Controlled Oral Word Association Test Modified Wisconsin Card Sorting Test Stroop Color & Word Test Trail Making  A & B Wechsler Adult Intelligence Scale-IV: Clinical cytogeneticist, Coding, Digit Span & Similarities  Wechsler Memory Scale-IV: Older Adult Battery Wide Range Achievement Test-4: Word Reading  Assessment Results Test Validity & Interpretive Considerations It was concluded that the test results represented a valid measure of her current cognitive functioning. She did not report or display problems with vision (she wore her eyeglasses), hearing or motor skills. She was able to comprehend task instructions without difficulty. She appeared to sustain attention and persist to task. She appeared to expend maximal effort.   Her intellectual potential was estimated to fall at least within the Average range based on her educational level (i.e., Master's degree) coupled with a measure of word reading (Wide Range Achievement Test-4) that represents an over-learned verbal skill relatively resistant to the effects of neurological disorder.   Her test scores were corrected to reflect norms for her age and, whenever possible, her gender and educational level (i.e., 18 years). A listing of test results can be found at the end of this report.  Speed of Processing & Attention Her performances on tests that required processing speed were uniformly within the Average range  These tests required transcribing symbols to match digits using a key (Wechsler Adult Intelligence Scale-IV (WAIS-IV) Coding), drawing lines to connect randomly arrayed numbers in sequence (Trails A) and reading words or naming color hues (Stroop Color and Word Test).   Her auditory attentional capacity to encode, hold and manipulate information in temporary memory was within normal expectations based on her ability to mentally rearrange digits in reverse or ascending order (WAIS-IV Digit Span). In contrast, her performance on a measure of visual working memory that required immediate recognition of symbols in left to right order (Wechsler Memory Scale-IV  (WMS-IV) Symbol Span) fell within the Borderline range.     Learning & Memory Composite measures of her immediate memory (WMS-IV Immediate Memory Index) and her delayed memory (WMS-IV Delayed Memory Index) both fell within the Average range. Her delayed memory was as expected given her initial level of learning.  A measure of her ability to learn and retain orally-presented information (WMS-IV Auditory Memory Index) fell within the Average range at the 53rd percentile. Her immediate recall of stories and word pairs fell within the Average range. After a twenty to thirty minute delay, she demonstrated normal retention of word pairs initially learned. In contrast, her delayed recall of stories was lower than expected (savings= 60%) as her recall score declined from the Average to the Low Average range after the delay interval. Her delayed recognition of story content was relatively better than her free delayed recall, which suggested that she had difficulty for retrieving story information rather than for storing it in memory.  A measure of her ability to learn and retain visual information (WMS-IV Visual Memory Index) fell within the Low Average range at the 23rd percentile, which was lower than expected given her auditory memory. Her ability to draw figural designs from memory immediately after viewing them fell within the Low Average range. Her delayed recall of the figural designs fell within the Average range and thus indicated normal retention.  Language Naming to confrontation [facility] [employer]) was severely impaired. For example, she could not name drawings of relatively common objects, such as a helicopter, camel, pretzel and racquet.  All of her errors reflected loss  of the semantic label rather than a perceptual error. A measure of her phonemic fluency (Controlled Oral Word Association Test) fell within the Borderline range while a measure of category fluency (Animal Naming Test) fell at the  lower boundary of the Low Average range. Her ability to read words (Wide Range Achievement Test-4: Word Reading) fell within the Average range.   Visual-Spatial Perception & Organization There were no signs of spatial inattention or problems with visual recognition. Her score on a test of visuospatial organization that required assembly of two-dimensional block designs from models (WAIS-IV Block Design) fell within the Average range. Her drawing of a clock face (Clock Drawing Test) was without error.  Executive Function She performed within normal expectations on tests of executive functions except for verbal fluency. She scored within the Average range on a test that required complex sequencing and set shifting (Trails B). Good mental flexibility and response inhibition was suggested by her ability to efficiently name the print color of a word while simultaneously ignoring the conflicting word (Stroop Color Word Test). She scored within the Average range on a measure of abstract verbal reasoning that required classifying ostensibly different objects or ideas to a shared category (WAIS-IV Similarities). She performed within the Average range on a test of nonverbal problem-solving that demanded inferential reasoning and conceptual flexibility (Modified [employer]).   Emotional Status The Brief Symptom Inventory (BSI) is a 53-item self-report questionnaire listing symptoms of physical and psychological distress. Overall, she endorsed having experienced a clinically significant level of global psychological distress within the past week relative to non-patient female norms. All of her BSI subscales were elevated except for a measure of phobic anxiety. Her highest BSI subscales reflected her endorsement of cognitive problems, somatic symptoms and paranoid or suspicious thought. Examination of individual items indicated that her most distressful symptoms (i.e., 4 on a 0 - 4 scale) were trouble  with memory, hot or cold spells, numbness, feeling hopeless about the future and her belief that others are to blame for her troubles.  Summary & Conclusions Alexandra Jackson is a 65 year-old woman who reported having experienced problems with memory and word-finding over the past two years. Throughout the interview, this pleasant woman repeatedly launched into brief rants about having been mistreated by insurance companies and medical professionals. During these rants she used nonsensical rhyming words (e.g., "willy wocky flackey wacky things"). She did not express any other types of bizarre or delusional thought.   Her husband reported that over the past two years she has demonstrated a decline in her abilities to find words while speaking and to remember names of people. He has not observed her to be unusually forgetful, however. He described her as prone to become easily irritated or anxious though far less moody over the past two years since she began taking a medication "to level her mood". He reported that for up to ten years prior to starting on that medication she had displayed mood swings characterized by rapid shifts from anger to sadness to anxiety. He has not observed any changes in her social comportment or habits within the past two years. He stated his belief that she has lately been depressed over changes in her appearance due to aging.  Neuropsychological testing indicated impairment of expressive language characterized by a severe deficit for confrontational naming and borderline to mild impairment of phonemic and semantic verbal fluency. Other than an isolated low score on a test of visual working memory, her cognitive abilities  were otherwise within normal expectations. While she performed within normal expectations on the memory battery, her visual memory did fall within the Low Average range, which might represent a mild decline from pre-morbid level. Finally, it was notable that  she performed within normal expectations on measures of executive function.  With regards to her psychological functioning, she described herself as having felt depressed for many years with sad mood, loss of interests and passive suicidal ideation. On a standardized psychiatric symptom inventory, she endorsed having experienced a broad range of symptoms, especially cognitive problems, somatic symptoms and paranoid/suspicious thought.   In conclusion, it is difficult to determine whether her behavior and symptoms reflect a neurological or a psychiatric disorder, or perhaps both. The finding of impaired expressive language would point towards a neurological dysfunction. 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 Her use of nonsensical rhyming words while angry or excited could represent paraphasic language although it also sounded like clanging as seen in some psychiatric disorders. While her emotional lability and fixation of themes of being mistreated might suggest the behavioral variant of frontotemporal dementia, this notion is undercut (though not entirely discounted) by her husband's report that her mood control and social comportment have actually been better over the past two years subsequent to taking a mood medication (divalproex?) and by her normal performances on tests of executive-frontal function, which are typically the best neuropsychological indicators of FTD. Finally, she reported a high and chronic degree of affective distress that could also be dampening her everyday cognitive functioning.  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 A psychiatric  consultation could be considered for input on differential diagnosis and possible treatment.  2. The current test results comprise a baseline of her cognitive functioning. A repeat neuropsychological evaluation should be considered in the future to track for changes, if any, in her cognitive and neurobehavioral functioning over time.  I have appreciated the opportunity to evaluate Alexandra Jackson. Her performance on neuropsychological testing was discussed with her by phone on 03/15/ 17. Diagnostic issues were not discussed. Please feel free to contact me with any comments or questions.       _____________________ Gladstone PihMichael F. Kaelan Amble, Ph.D Licensed Psychologist       ADDENDUM-NEUROPSYCHOLOGICAL TEST RESULTS  Animal Naming Test Score= 16  9th  (adjusted for age, gender and educational level)   Boston Naming Test Score= 20/60 <1st  (adjusted for age, gender and educational level)   Clock Drawing Test Score=10/10 Normal   Controlled Oral Word Association Test Score= 27 words/0 repetitions 5th (adjusted for age, gender and educational level)   Modified Wisconsin Card Sorting Test  Categories correct      6 73rd  (adjusted for age and education)  Perseverative errors     1 54th           Total errors      2 86th      Executive function composite 107 66th       Stroop Color & Word Test  Score residual % (adjusted for age and educational level)  Word 103 -6 34th     Color   78  0 50th     Color-Word   48  6 73rd      Trails A Score=  29s  0e 47th (adjusted for age, gender and  educational level)  Trails B Score= 72s  0e 47th (adjusted for age, gender and educational level)   Wechsler Adult Intelligence Scale-IV   Subtest Scaled Score Percentile  Block Design   8   25th    Similarities   9   37th    Digit Span   Forward   Backward   Sequencing  91st   <99th    50th    84th    Coding     8   25th       Wechsler Memory Scale-IV Older Adult  Battery Index Index Score Percentile  Immediate Memory   97 42nd      Auditory Memory 101 53rd    Visual Memory   89 23rd      Delayed Memory   96 39th     Symbol Span subtest Scaled score= 5    5th      Wide Range Achievement Test-4 Subtest  Raw score Standard score Percentile  Word Reading 61/70 101 53rd

## 2015-12-24 ENCOUNTER — Telehealth: Payer: Self-pay | Admitting: Neurology

## 2015-12-24 NOTE — Telephone Encounter (Signed)
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 A psychiatric consultation was  recommended. The patient will be seen in office tomorrow.  Neuropsychological testing:  Summary & Conclusions Alexandra Jackson is a 65 year-old woman who reported having experienced problems with memory and word-finding over the past two years. Throughout the interview, this pleasant woman repeatedly launched into brief rants about having been mistreated by insurance companies and medical professionals. During these rants she used nonsensical rhyming words (e.g., "willy wocky flackey wacky things"). She did not express any other types of bizarre or delusional thought.   Her husband reported that over the past two years she has demonstrated a decline in her abilities to find words while speaking and to remember names of people. He has not observed her to be unusually forgetful, however. He described her as prone to become easily irritated or anxious though far less moody over the past two years since she began taking a medication "to level her mood". He reported that for up to ten years prior to starting on that medication she had displayed mood swings characterized by rapid shifts from anger to sadness to anxiety. He has not observed any changes in her social comportment or habits within the past two years. He stated his belief that she has lately been depressed over changes in her appearance due to aging.  Neuropsychological testing indicated impairment of expressive language characterized by a severe deficit for confrontational naming and borderline to mild impairment of phonemic and semantic verbal fluency. Other than an isolated low score on a test of visual working memory, her cognitive abilities were otherwise within normal expectations.  While she performed within normal expectations on the memory battery, her visual memory did fall within the Low Average range, which might represent a mild decline from pre-morbid level. Finally, it was notable that she performed within normal expectations on measures of executive function.  With regards to her psychological functioning, she described herself as having felt depressed for many years with sad mood, loss of interests and passive suicidal ideation. On a standardized psychiatric symptom inventory, she endorsed having experienced a broad range of symptoms, especially cognitive problems, somatic symptoms and paranoid/suspicious thought.   In conclusion, it is difficult to determine whether her behavior and symptoms reflect a neurological or a psychiatric disorder, or perhaps both. The finding of impaired expressive language would point towards a neurological dysfunction. 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 Her use of nonsensical rhyming words while angry or excited could represent paraphasic language although it also sounded like clanging as seen in some psychiatric disorders. While her emotional lability and fixation of themes of being mistreated might suggest the behavioral variant of frontotemporal dementia, this notion is undercut (though not entirely discounted) by her husband's report that her mood control and social comportment have actually been better over the past two years subsequent to taking a mood medication (divalproex?) and by her normal performances on tests of executive-frontal function, which are typically the best neuropsychological indicators of FTD. Finally, she reported a high and chronic degree of affective distress that could also be dampening her everyday cognitive functioning.  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 A psychiatric consultation could be considered for input on differential diagnosis and possible treatment.  2. The current test results comprise a baseline of her cognitive functioning. A repeat neuropsychological evaluation should be considered in the future to track for changes, if any, in her cognitive and neurobehavioral functioning over time.

## 2015-12-25 ENCOUNTER — Encounter: Payer: Self-pay | Admitting: Adult Health

## 2015-12-25 ENCOUNTER — Ambulatory Visit (INDEPENDENT_AMBULATORY_CARE_PROVIDER_SITE_OTHER): Payer: Medicare Other | Admitting: Adult Health

## 2015-12-25 VITALS — BP 96/68 | HR 74 | Wt 125.0 lb

## 2015-12-25 DIAGNOSIS — R413 Other amnesia: Secondary | ICD-10-CM | POA: Diagnosis not present

## 2015-12-25 NOTE — Patient Instructions (Signed)
I recommend Aricept for memory and a referral to psychiatrist If you are interested please let me know.  Donepezil tablets What is this medicine? DONEPEZIL (doe NEP e zil) is used to treat mild to moderate dementia caused by Alzheimer's disease. This medicine may be used for other purposes; ask your health care provider or pharmacist if you have questions. What should I tell my health care provider before I take this medicine? They need to know if you have any of these conditions: -asthma or other lung disease -difficulty passing urine -head injury -heart disease -history of irregular heartbeat -liver disease -seizures (convulsions) -stomach or intestinal disease, ulcers or stomach bleeding -an unusual or allergic reaction to donepezil, other medicines, foods, dyes, or preservatives -pregnant or trying to get pregnant -breast-feeding How should I use this medicine? Take this medicine by mouth with a glass of water. Follow the directions on the prescription label. You may take this medicine with or without food. Take this medicine at regular intervals. This medicine is usually taken before bedtime. Do not take it more often than directed. Continue to take your medicine even if you feel better. Do not stop taking except on your doctor's advice. If you are taking the 23 mg donepezil tablet, swallow it whole; do not cut, crush, or chew it. Talk to your pediatrician regarding the use of this medicine in children. Special care may be needed. Overdosage: If you think you have taken too much of this medicine contact a poison control center or emergency room at once. NOTE: This medicine is only for you. Do not share this medicine with others. What if I miss a dose? If you miss a dose, take it as soon as you can. If it is almost time for your next dose, take only that dose, do not take double or extra doses. What may interact with this medicine? Do not take this medicine with any of the following  medications: -certain medicines for fungal infections like itraconazole, fluconazole, posaconazole, and voriconazole -cisapride -dextromethorphan; quinidine -dofetilide -dronedarone -pimozide -quinidine -thioridazine -ziprasidone This medicine may also interact with the following medications: -antihistamines for allergy, cough and cold -atropine -bethanechol -carbamazepine -certain medicines for bladder problems like oxybutynin, tolterodine -certain medicines for Parkinson's disease like benztropine, trihexyphenidyl -certain medicines for stomach problems like dicyclomine, hyoscyamine -certain medicines for travel sickness like scopolamine -dexamethasone -ipratropium -NSAIDs, medicines for pain and inflammation, like ibuprofen or naproxen -other medicines for Alzheimer's disease -other medicines that prolong the QT interval (cause an abnormal heart rhythm) -phenobarbital -phenytoin -rifampin, rifabutin or rifapentine This list may not describe all possible interactions. Give your health care provider a list of all the medicines, herbs, non-prescription drugs, or dietary supplements you use. Also tell them if you smoke, drink alcohol, or use illegal drugs. Some items may interact with your medicine. What should I watch for while using this medicine? Visit your doctor or health care professional for regular checks on your progress. Check with your doctor or health care professional if your symptoms do not get better or if they get worse. You may get drowsy or dizzy. Do not drive, use machinery, or do anything that needs mental alertness until you know how this drug affects you. What side effects may I notice from receiving this medicine? Side effects that you should report to your doctor or health care professional as soon as possible: -allergic reactions like skin rash, itching or hives, swelling of the face, lips, or tongue -changes in vision -feeling faint or lightheaded,  falls -problems with balance -redness, blistering, peeling or loosening of the skin, including inside the mouth -slow heartbeat, or palpitations -stomach pain -unusual bleeding or bruising, red or purple spots on the skin -vomiting -weight loss Side effects that usually do not require medical attention (report to your doctor or health care professional if they continue or are bothersome): -diarrhea, especially when starting treatment -headache -indigestion or heartburn -loss of appetite -muscle cramps -nausea This list may not describe all possible side effects. Call your doctor for medical advice about side effects. You may report side effects to FDA at 1-800-FDA-1088. Where should I keep my medicine? Keep out of reach of children. Store at room temperature between 15 and 30 degrees C (59 and 86 degrees F). Throw away any unused medicine after the expiration date. NOTE: This sheet is a summary. It may not cover all possible information. If you have questions about this medicine, talk to your doctor, pharmacist, or health care provider.    2016, Elsevier/Gold Standard. (2014-05-04 07:51:52)

## 2015-12-25 NOTE — Progress Notes (Signed)
I have read the note, and I agree with the clinical assessment and plan.  Alexandra Jackson   

## 2015-12-25 NOTE — Progress Notes (Signed)
PATIENT: Alexandra Jackson DOB: September 29, 1951  REASON FOR VISIT: follow up HISTORY FROM: patient  HISTORY OF PRESENT ILLNESS: Ms. Alexandra Carina- Eulah Jackson is a 65 year old female with a history of memory disturbance. She returns today for an evaluation. The patient did have neuropsychological testing that suggested possible frontotemporal dementia or psychiatric disorder. The patient states that she continues to have trouble with remembering names. She is able to complete most ADLs independently. She operates a Librarian, academic without difficulty. During the exam she had several verbal outbursts. She states that [employer] [place] is " taking her money." She states that  401Ks are a scam and they are taking money from the working class. She states that she cannot receive any treatment because her and her husband do not have any " money." She then goes on to state that she would need 700,000 in her checking and 200,000 in her savings to make things work. She often uses rhyming phrases when describing things. Such as "trumpy dumpy" and "poopy woopy politicians". She also uses curse words throughout the visit. She is here alone today. She returns for an evaluation.  HISTORY 09/24/15 (WILLIS): Ms. Alexandra Jackson is a 65 year old right-handed white female with a history of some issues with memory dating back at least 2-3 years. The patient comes to the office by herself, she provides the history. She indicates that she has had a lot of issues with word finding problems and remembering names for people and things. She has had some significant issues with depression. The patient indicates that she has difficulty recognizing faces and names for people and places and things. She has short-term memory issues, she cannot recall recent conversations or events. She indicates that she does not have troubles with driving or with directions. She may have some issues keeping up with medications and appointments. When asked about how  she does with finances, she demonstrates significant issues with delusional thinking, she indicates that companies that sent her bills for things "want her to die", and she offers unusual views on what motivates [employer] [place]. The patient retired at age 14, she felt that she was not able to perform her job well because of memory. She does note some issues with balance, she does have some occasional episodes of dizziness. She reports some occasional numbness in the arms and legs, and occasional leg cramps. She has had some mild changes in bladder control. She reports that her appetite is decreased. She has undergone a CT scan of the brain that was done on 07/18/2014, I have reviewed this online and it appears to be unremarkable. She is sent to this office for an evaluation.  REVIEW OF SYSTEMS: Out of a complete 14 system review of symptoms, the patient complains only of the following symptoms, and all other reviewed systems are negative.  Appetite change, chills, fatigue, light sensitivity, ringing in ears, chest pain, leg cramps, constipation, restless legs, daytime sleepiness, joint pain, back pain, aching muscles, muscle cramps, neck pain, neck stiffness, rash, nervous/anxious, depression, confusion, memory loss, dizziness, headache, speech difficulty  ALLERGIES: Allergies  Allergen Reactions  . Metoprolol Tartrate     REACTION: dizzy, diaphoretic, and weak    HOME MEDICATIONS: Outpatient Prescriptions Prior to Visit  Medication Sig Dispense Refill  . aspirin 325 MG tablet Take 325 mg by mouth daily.    . cholecalciferol (VITAMIN D) 1000 UNITS tablet Take 1,000 Units by mouth daily. 5000u    . digoxin (LANOXIN) 0.125 MG tablet Take 1 tablet (0.125 mg total)  by mouth daily. 90 tablet 3  . divalproex (DEPAKOTE ER) 500 MG 24 hr tablet Take 1 tablet by mouth daily.  4  . gabapentin (NEURONTIN) 300 MG capsule Take one pill daily    . magnesium 30 MG tablet Take 30 mg by mouth daily.     Marland Kitchen  MELATONIN PO Take by mouth at bedtime.    . Omega 3 1000 MG CAPS Take 1 capsule by mouth daily. Krill oil    . pindolol (VISKEN) 5 MG tablet MAY TAKE 1/2 T0 1 TABLET  A DAILY AS NEEDED 90 tablet 3  . vitamin B-12 (CYANOCOBALAMIN) 1000 MCG tablet Take 1,000 mcg by mouth daily.     No facility-administered medications prior to visit.    PAST MEDICAL HISTORY: Past Medical History  Diagnosis Date  . Atrial fibrillation (HCC)     reported -   . Fibromyalgia   . Degenerative disc disease   . Chronic chest pain     has had several cardiac tests negative including cardiac cath in 2010, Myoview in 10/2012  . Bilateral leg cramps   . Dental crown present   . Snores   . Memory difficulties 09/24/2015  . Delusional disorder (HCC) 09/24/2015    PAST SURGICAL HISTORY: Past Surgical History  Procedure Laterality Date  . Cardiac catheterization  2010    No signficant CAD  . Tubal ligation    . Dilation and curettage of uterus    . Colonoscopy    . Tonsillectomy    . Orif wrist fracture  2003    left  . Hardware removal  2004    left wrist  . Brow lift Bilateral 11/01/2013    Procedure: BILATERAL UPPER BLEPHAROPLASTY;  Surgeon: Etter Sjogren, MD;  Location: [facility];  Service: Plastics;  Laterality: Bilateral;    FAMILY HISTORY: Family History  Problem Relation Age of Onset  . Dementia Mother     SOCIAL HISTORY: Social History   Social History  . Marital Status: Married    Spouse Name: N/A  . Number of Children: N/A  . Years of Education: N/A   Occupational History  . Not on file.   Social History Main Topics  . Smoking status: Never Smoker   . Smokeless tobacco: Not on file  . Alcohol Use: 0.0 oz/week    0 Standard drinks or equivalent per week     Comment: daily 2 beer-wine  . Drug Use: No  . Sexual Activity: Not on file   Other Topics Concern  . Not on file   Social History Narrative   Married mother of one. Does not get routine exercise.    Quit smoking in 1989. Drinks occasional glass of peanut lesion to to help her relax at night.   Drinks gluten free beers as well.   She is on a gluten-free diet.      PHYSICAL EXAM  Filed Vitals:   12/25/15 1330  BP: 96/68  Pulse: 74  Weight: 125 lb (56.7 kg)   Body mass index is 17.94 kg/(m^2).  Generalized: Well developed, in no acute distress   Neurological examination  Mentation: Alert oriented to time, place, history taking. Follows all commands speech and language fluent Cranial nerve II-XII: Pupils were equal round reactive to light. Extraocular movements were full, visual field were full on confrontational test. Facial sensation and strength were normal. Uvula tongue midline. Head turning and shoulder shrug  were normal and symmetric. Motor: The motor testing reveals 5 over  5 strength of all 4 extremities. Good symmetric motor tone is noted throughout.  Sensory: Sensory testing is intact to soft touch on all 4 extremities. No evidence of extinction is noted.  Coordination: Cerebellar testing reveals good finger-nose-finger and heel-to-shin bilaterally.  Gait and station: Gait is normal. Tandem gait is normal. Romberg is negative. No drift is seen.  Reflexes: Deep tendon reflexes are symmetric and normal bilaterally.   DIAGNOSTIC DATA (LABS, IMAGING, TESTING) - I reviewed patient records, labs, notes, testing and imaging myself where available.   ASSESSMENT AND PLAN 65 y.o. year old female  has a past medical history of Atrial fibrillation (HCC); Fibromyalgia; Degenerative disc disease; Chronic chest pain; Bilateral leg cramps; Dental crown present; Snores; Memory difficulties (09/24/2015); and Delusional disorder (HCC) (09/24/2015). here with:  1. Memory disturbance- possible frontotemporal dementia versus psychiatric disorder  The patient's memory score has decreased since the last visit. Her MMSE today is 26/30 was briefly 29/30. I have recommended possible Aricept  however she states that she cannot afford this- despite me telling her that without insurance is only $11 a month. I also recommended referring her to psychiatry [facility]). She states that she will think about this. Throughout our visit today she did not want to talk about her medical issues were rather finances and politics. It seems that she does have some delusional thinking. The patient will let you know if she decides that she wants to see psychiatry or start Aricept.  I spent 25 minutes with the patient 50% of this time was spent discussing her diagnoses and treatment.     Butch PennyMegan Cris Gibby, MSN, NP-C 12/25/2015, 3:48 PM [facility] [address], [address] [place], [place] [id number] [phone]

## 2016-03-04 ENCOUNTER — Encounter: Payer: Self-pay | Admitting: Cardiology

## 2016-03-04 ENCOUNTER — Ambulatory Visit (INDEPENDENT_AMBULATORY_CARE_PROVIDER_SITE_OTHER): Payer: Medicare Other | Admitting: Cardiology

## 2016-03-04 VITALS — BP 100/71 | HR 67 | Ht 67.0 in | Wt 125.6 lb

## 2016-03-04 DIAGNOSIS — Z8679 Personal history of other diseases of the circulatory system: Secondary | ICD-10-CM | POA: Diagnosis not present

## 2016-03-04 DIAGNOSIS — R002 Palpitations: Secondary | ICD-10-CM

## 2016-03-04 DIAGNOSIS — IMO0001 Reserved for inherently not codable concepts without codable children: Secondary | ICD-10-CM

## 2016-03-04 DIAGNOSIS — M609 Myositis, unspecified: Secondary | ICD-10-CM

## 2016-03-04 DIAGNOSIS — Z79899 Other long term (current) drug therapy: Secondary | ICD-10-CM | POA: Diagnosis not present

## 2016-03-04 DIAGNOSIS — M791 Myalgia: Secondary | ICD-10-CM | POA: Diagnosis not present

## 2016-03-04 NOTE — Patient Instructions (Signed)
LABS - PLEASE DO ON A DAY -IN WHICH YOU TAKE DIGOXIN AFTER YOU HAVE LAB WORK DONE.  CMP,magnesium, digoxin level   NO OTHER CHANGES WITH MEDICATIONS  Your physician wants you to follow-up in 12 MONTHS WITH DR HARDING. You will receive a reminder letter in the mail two months in advance. If you don't receive a letter, please call our office to schedule the follow-up appointment.  If you need a refill on your cardiac medications before your next appointment, please call your pharmacy.

## 2016-03-04 NOTE — Progress Notes (Signed)
PCP: Londell Moh, MD  Clinic Note: Chief Complaint  Patient presents with  . Annual Exam    SOB; when walking long distance. LIGHTHEADED/DIZZINESS; randomly. cramping in legs.  . Palpitations    HPI: Alexandra Jackson is a 65 y.o. female with a PMH below who presents today for Annual follow-up for palpitations. Prior cardiac evaluations include:  Echocardiogram in 2006 which was essentially normal with exception of septal hypokinesis and EF of 50%.  Cardiac catheterization in 2010 following an episode of nonsustained V. tach that showed no evidence of significant CAD.   CPET Excess Tolerance Test that was really unhelpful so she didn't even come close to her target exercise level.   LexiScan Myoview January 2014: low risk of no ischemia or infarction in EF 71%. . She takes a 2.5 mg of pindolol for blood pressure/palpitations.  Jayel Scaduto Kurr-Murphy was last seen in May 2016. Recommendation WUJ:[id number] with her having some fatigue we will switch her to take her standing dose of pindolol in the evening with only a when necessary dose during the day if necessary.  Recent Hospitalizations: none  Studies Reviewed: none  Has seen Dr.Willis & is seeing a Veterinary surgeon.  Interval History: Alexandra Jackson much doing well - only a few episodes of palpitations.  Did get upset with pharmacy re cost of her meds - this made her HR go up, but otherwise stable.  Sometimes dizzy - but has sleeping issues.  Still upset about the election - "horrible-worrible, scammy-whammy", "only ones being helped are CEO-peeny/ milli-billis pleasure".    Overall from a cardiac standpoint. She is doing well. Minimal palpitations except when she gets upset. She gets short of breath if she walks too far, but not with routine activity.  Cardiovascular review of symptoms as follows: No chest pain or shortness of breath with rest or exertion.  No PND, orthopnea or edema.  No syncope/near syncope. No  TIA/amaurosis fugax symptoms. No melena, hematochezia, hematuria, or epstaxis. No claudication.  ROS: A comprehensive was performed. Review of Systems  Constitutional: Negative for malaise/fatigue.  HENT: Negative for congestion and nosebleeds.   Respiratory: Negative for cough and wheezing.   Cardiovascular: Positive for palpitations. Negative for chest pain, orthopnea, claudication, leg swelling and PND.  Gastrointestinal: Negative for constipation and blood in stool.  Genitourinary: Negative for hematuria.  Musculoskeletal: Positive for myalgias (leg cramps - at night.) and joint pain (Bilateral shoulder arm and neck pain). Negative for back pain and falls.  Neurological: Positive for dizziness (worse if she takes extra dose of melatonin.). Negative for loss of consciousness and headaches.  Psychiatric/Behavioral: Positive for depression and memory loss. The patient is nervous/anxious and has insomnia (takes melatonin).   Occasionally has low EtOH gluten free beer.  Past Medical History  Diagnosis Date  . Atrial fibrillation (HCC)     reported -   . Fibromyalgia   . Degenerative disc disease   . Chronic chest pain     has had several cardiac tests negative including cardiac cath in 2010, Myoview in 10/2012  . Bilateral leg cramps   . Dental crown present   . Snores   . Memory difficulties 09/24/2015  . Delusional disorder (HCC) 09/24/2015    Past Surgical History  Procedure Laterality Date  . Cardiac catheterization  2010    No signficant CAD  . Tubal ligation    . Dilation and curettage of uterus    . Colonoscopy    . Tonsillectomy    . Orif wrist  fracture  2003    left  . Hardware removal  2004    left wrist  . Brow lift Bilateral 11/01/2013    Procedure: BILATERAL UPPER BLEPHAROPLASTY;  Surgeon: Etter Sjogrenavid Bowers, MD;  Location: [facility];  Service: Plastics;  Laterality: Bilateral;  . Nm myoview ltd  April 2014    Normal LV function and wall motion. EF  73%. No ischemia or infarction. LOW RISK    Prior to Admission medications   Medication Sig Start Date End Date Taking? Authorizing Provider  aspirin 325 MG tablet Take 325 mg by mouth daily.   Yes Historical Provider, MD  BIOTIN PO Take by mouth.   Yes Historical Provider, MD  cholecalciferol (VITAMIN D) 1000 UNITS tablet Take 1,000 Units by mouth daily. 5000u   Yes Historical Provider, MD  digoxin (LANOXIN) 0.125 MG tablet Take 1 tablet (0.125 mg total) by mouth daily. 03/23/15  Yes [name], MD  divalproex (DEPAKOTE ER) 500 MG 24 hr tablet Take 1 tablet by mouth daily. 01/27/15  Yes Historical Provider, MD  gabapentin (NEURONTIN) 300 MG capsule Take one pill daily 09/21/15  Yes Historical Provider, MD  magnesium 30 MG tablet Take 30 mg by mouth daily.    Yes Historical Provider, MD  MELATONIN PO Take by mouth at bedtime.   Yes Historical Provider, MD  Omega 3 1000 MG CAPS Take 1 capsule by mouth daily. Krill oil   Yes Historical Provider, MD  pindolol (VISKEN) 5 MG tablet MAY TAKE 1/2 T0 1 TABLET  A DAILY AS NEEDED 03/23/15  Yes [name], MD  vitamin B-12 (CYANOCOBALAMIN) 1000 MCG tablet Take 1,000 mcg by mouth daily.   Yes Historical Provider, MD   Allergies  Allergen Reactions  . Metoprolol Tartrate     REACTION: dizzy, diaphoretic, and weak    Social History   Social History  . Marital Status: Married    Spouse Name: N/A  . Number of Children: N/A  . Years of Education: N/A   Social History Main Topics  . Smoking status: Never Smoker   . Smokeless tobacco: None  . Alcohol Use: 0.0 oz/week    0 Standard drinks or equivalent per week     Comment: daily 2 beer-wine  . Drug Use: No  . Sexual Activity: Not Asked   Other Topics Concern  . None   Social History Narrative   Married mother of one. Does not get routine exercise.   Quit smoking in 1989. Drinks occasional glass of peanut lesion to to help her relax at night.   Drinks gluten free beers as well.    She is on a gluten-free diet.    family history includes Dementia in her mother.   Wt Readings from Last 3 Encounters:  03/04/16 125 lb 9.6 oz (56.972 kg)  12/25/15 125 lb (56.7 kg)  09/24/15 130 lb (58.968 kg)    PHYSICAL EXAM BP 100/71 mmHg  Pulse 67  Ht 5\' 7"  (1.702 m)  Wt 125 lb 9.6 oz (56.972 kg)  BMI 19.67 kg/m2 General appearance: alert, cooperative, appears stated age, no distress and Well-nourished and well-groomed. Very talkative and somewhat tangential. Neck: no adenopathy, no carotid bruit, no JVD, supple, symmetrical, trachea midline and thyroid not enlarged, symmetric, no tenderness/mass/nodules Lungs: clear to auscultation bilaterally, normal percussion bilaterally and Nonlabored, good air movement Heart: regular rate and rhythm, S1, S2 normal, no murmur, click, rub or gallop and normal apical impulse Abdomen: soft, non-tender; bowel sounds normal;  no masses, no organomegaly Extremities: extremities normal, atraumatic, no cyanosis or edema Pulses: 2+ and symmetric Neurologic: Grossly normal    Adult ECG Report  Rate: 67 ;  Rhythm: normal sinus rhythm and Rightward axis - ~97.  Otherwise normal axis & intervals.  ;   Narrative Interpretation: Stable EKG   Other studies Reviewed: Additional studies/ records that were reviewed today include:   Recent Labs: 03/21/2015 - From PCP Na+ 142, K+ 4.9, Cl- 104, HCO3- 32 , BUN 10, Cr 0.9, Glu 90, Ca2+ 9.3; AST 17, ALT 26 AlkP 52, Alb 3.2, TP 6.7, T Bili 0.5 CBC: W 3.7, H/H 14.4/44.3, Plt 184 TC 229, TG 88, HDL 82, LDL 129   ASSESSMENT / PLAN: Problem List Items Addressed This Visit    Myalgia and myositis    Likely related to fibromyalgia.      Intermittent palpitations - Primary (Chronic)    For the most part controlled on pindolol and digitoxin. She was off digoxin for a while and had some recurrent symptoms. She really only has palpitations when she gets upset. Continue current regimen. Since she is on  digoxin, I would like to check a chemistry panel and digoxin level      Relevant Orders   EKG 12-Lead (Completed)   Comprehensive metabolic panel   Magnesium   Digoxin level   History of cardiovascular disorder    No recorded A. fib. Just palpitations. However she is on digoxin pindolol regardless. With no recorded A. fib, she is only on aspirin.       Other Visit Diagnoses    Drug therapy        Relevant Orders    Comprehensive metabolic panel    Magnesium    Digoxin level       Current medicines are reviewed at length with the patient today. (+/- concerns) none The following changes have been made: none   Change metoprolol xl ( toprol xl)  to 25 mg  One twice a day   DRINK PLENTY OF WATER UNTIL URINE IS CLEAR EACH DAY.- THAT MEANS YOU ARE WELL HYDRATED.   Your physician recommends that you schedule a follow-up appointment in 3 MONTHS WITH DR HARING.  Studies Ordered:   Orders Placed This Encounter  Procedures  . Comprehensive metabolic panel  . Magnesium  . Digoxin level  . EKG 12-Lead      Bryan Lemma, M.D., M.S. Interventional Cardiologist   Pager # [phone] Phone # [phone] [address] [place], [place] [address]

## 2016-03-05 ENCOUNTER — Encounter: Payer: Self-pay | Admitting: Cardiology

## 2016-03-05 NOTE — Assessment & Plan Note (Addendum)
For the most part controlled on pindolol and digitoxin. She was off digoxin for a while and had some recurrent symptoms. She really only has palpitations when she gets upset. Continue current regimen. Since she is on digoxin, I would like to check a chemistry panel and digoxin level

## 2016-03-05 NOTE — Assessment & Plan Note (Signed)
No recorded A. fib. Just palpitations. However she is on digoxin pindolol regardless. With no recorded A. fib, she is only on aspirin.

## 2016-03-05 NOTE — Assessment & Plan Note (Signed)
Likely related to fibromyalgia.

## 2016-03-08 ENCOUNTER — Other Ambulatory Visit: Payer: Self-pay | Admitting: Cardiology

## 2016-03-10 NOTE — Telephone Encounter (Signed)
Rx(s) sent to pharmacy electronically.  

## 2016-03-12 ENCOUNTER — Telehealth: Payer: Self-pay | Admitting: Cardiology

## 2016-03-12 ENCOUNTER — Telehealth: Payer: Self-pay | Admitting: Neurology

## 2016-03-12 NOTE — Telephone Encounter (Signed)
Pt's husband called in today wanting to inform you that the patient passed away on the morning of 6/5.

## 2016-03-12 NOTE — Telephone Encounter (Signed)
Events noted, the patient committed suicide.

## 2016-03-12 NOTE — Telephone Encounter (Signed)
Spouse called to cancel 07/02/16 appointment with Dr. Anne HahnWillis, advised that wife passed away Monday 03/25/2016.

## 2016-03-13 NOTE — Telephone Encounter (Signed)
That was quite unexpected.  I'm sorry to hear that.  She seemed relatively stable when I saw her not to long ago.   Bryan Lemmaavid Deysy Schabel, MD

## 2016-04-05 DEATH — deceased

## 2016-05-02 IMAGING — CT CT HEAD W/O CM
2 series · 16 of 30 positions shown, 20 images · non-contrast
Comparison: 02/12/2012

CLINICAL DATA: Memory loss x2 years, head trauma 3 years ago,
confusion, headaches

EXAM:
CT HEAD WITHOUT CONTRAST
TECHNIQUE: Contiguous axial images were obtained from the base of the skull
through the vertex without intravenous contrast.

[Series 3: head bone · axial · 0.49mm/px · z∈[+24,+64]mm · 3 of 28 slices shown]
[im 2/28  bone]
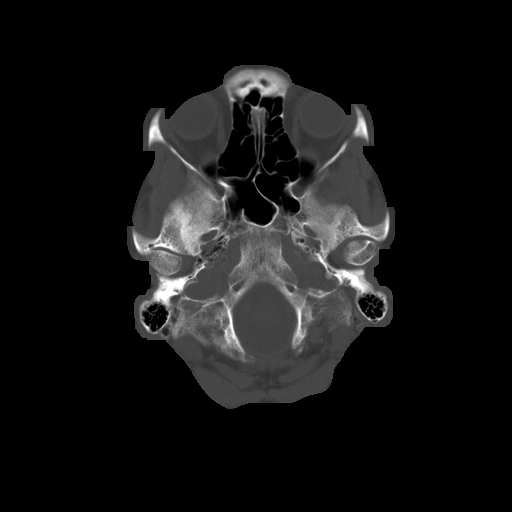
[im 6/28  bone]
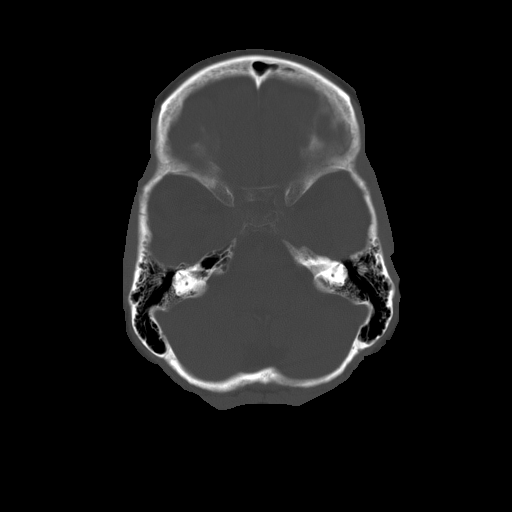
[im 10/28  bone]
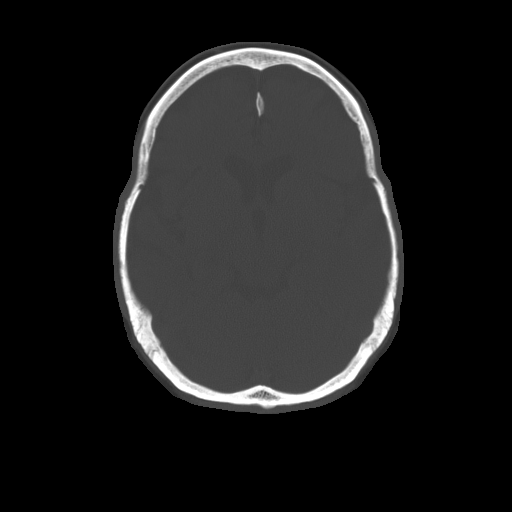

[Series 32: 3d filtered head w/o · axial · non-contrast · 0.49mm/px · z∈[+24,+144]mm · 13 of 28 slices shown, 17 images]
[im 2/28  brain]
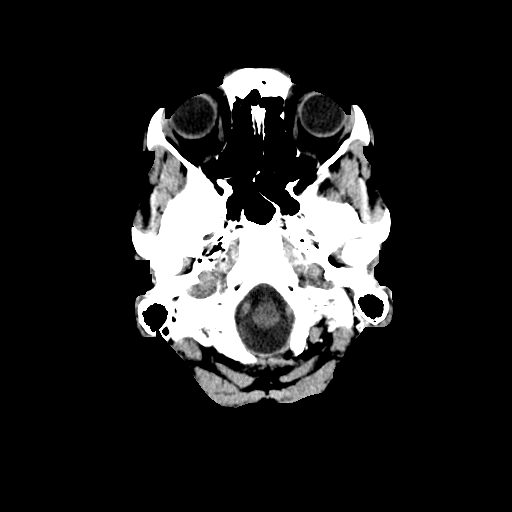
[im 2/28  bone]
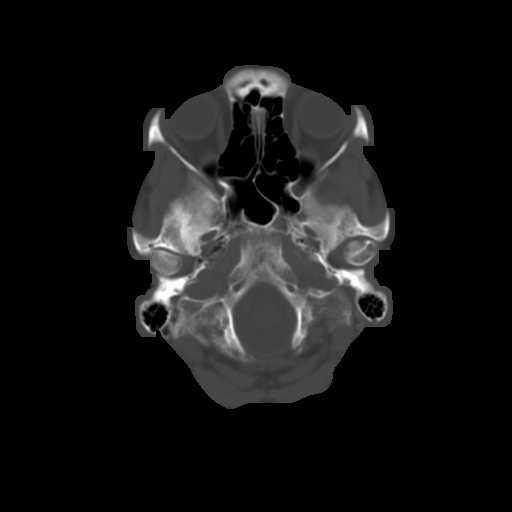
[im 4/28  brain]
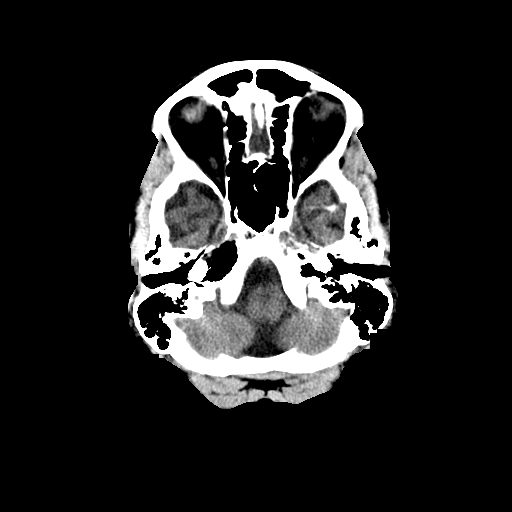
[im 6/28  brain]
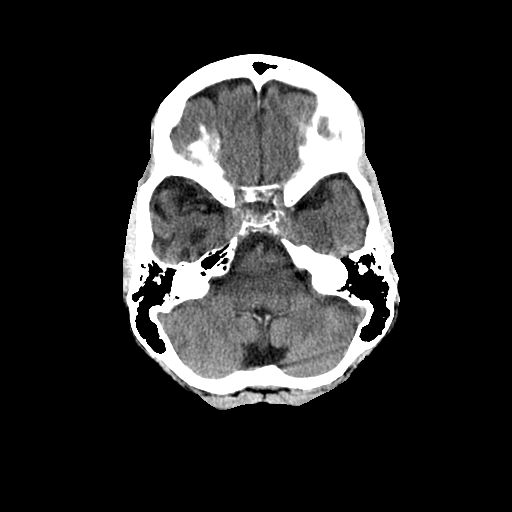
[im 8/28  brain]
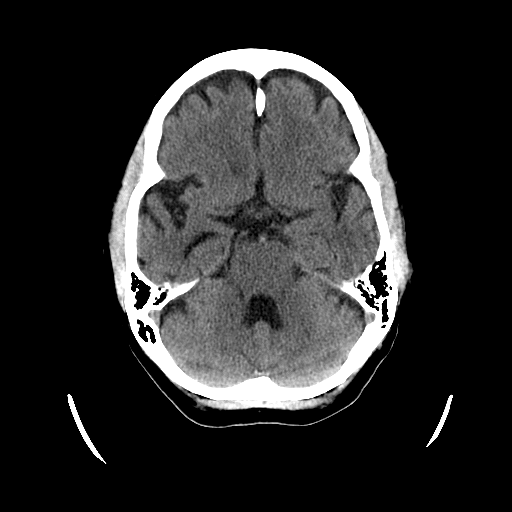
[im 10/28  brain]
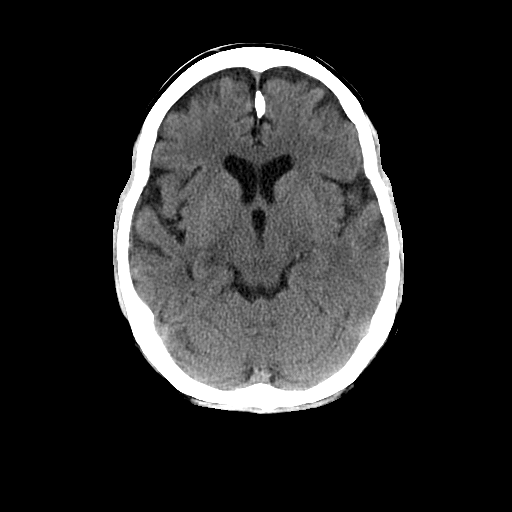
[im 10/28  bone]
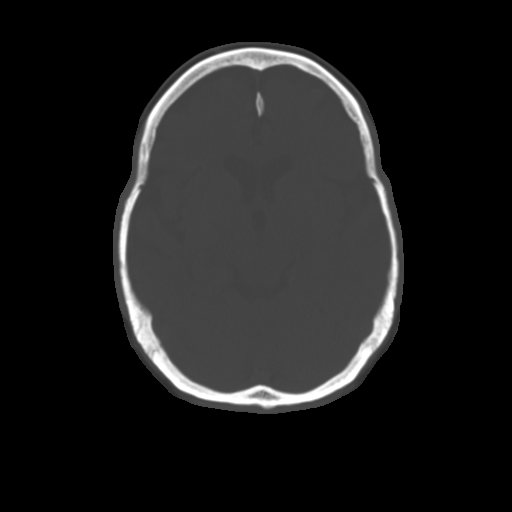
[im 12/28  brain]
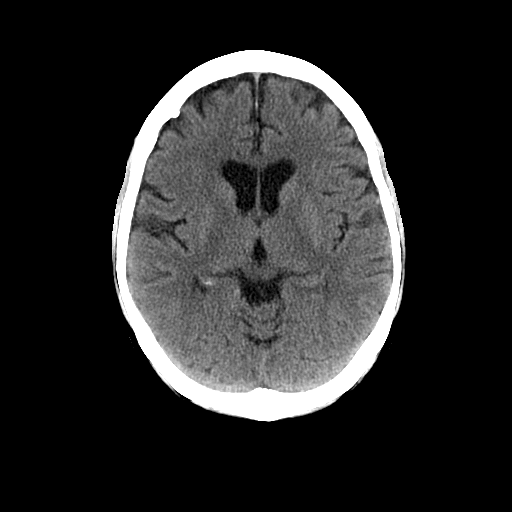
[im 14/28  brain]
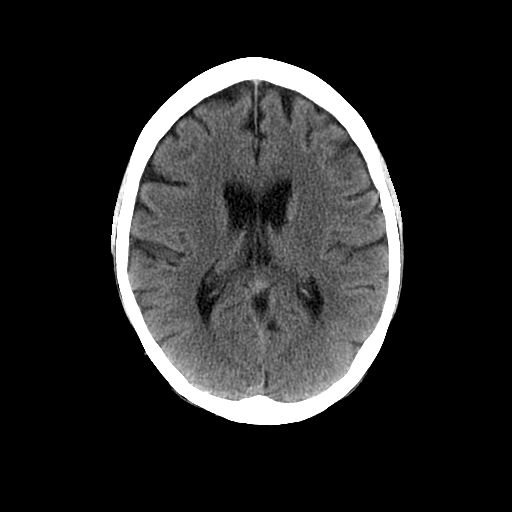
[im 16/28  brain]
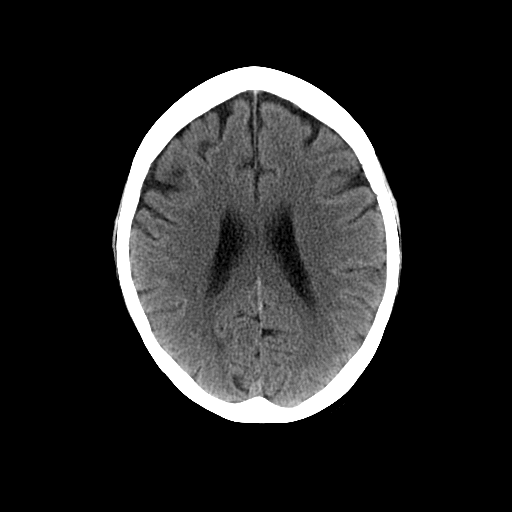
[im 18/28  brain]
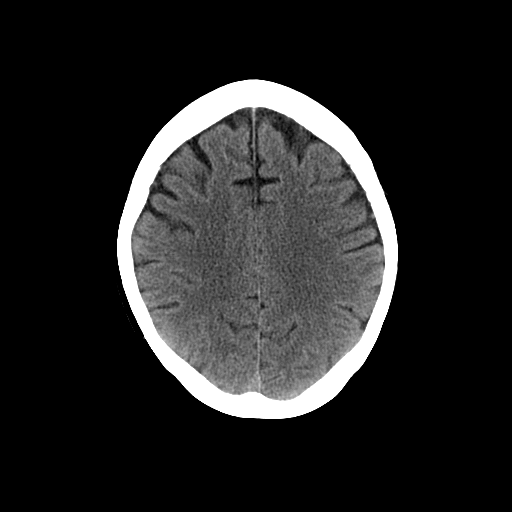
[im 18/28  bone]
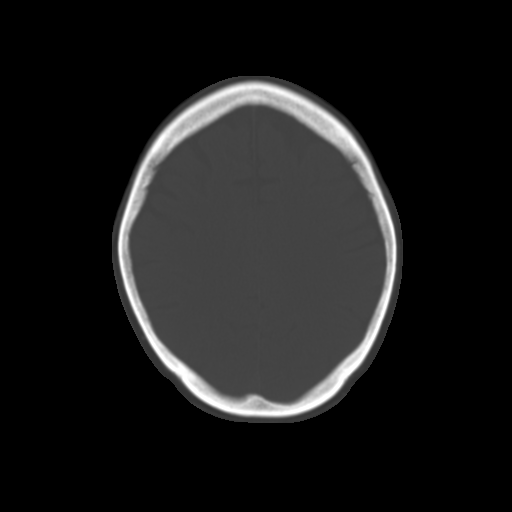
[im 20/28  brain]
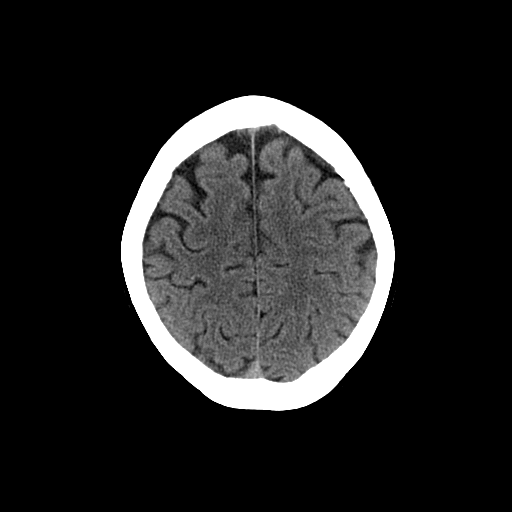
[im 22/28  brain]
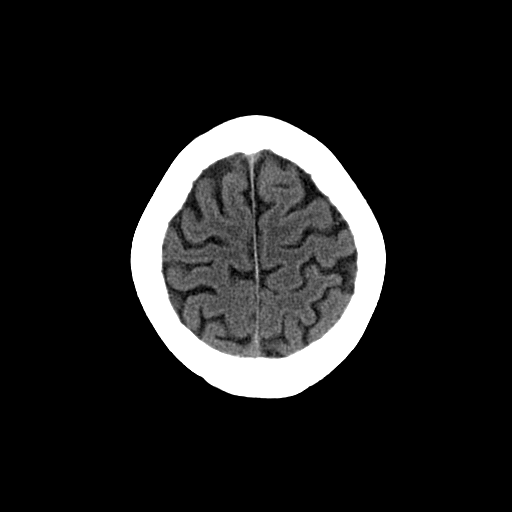
[im 24/28  brain]
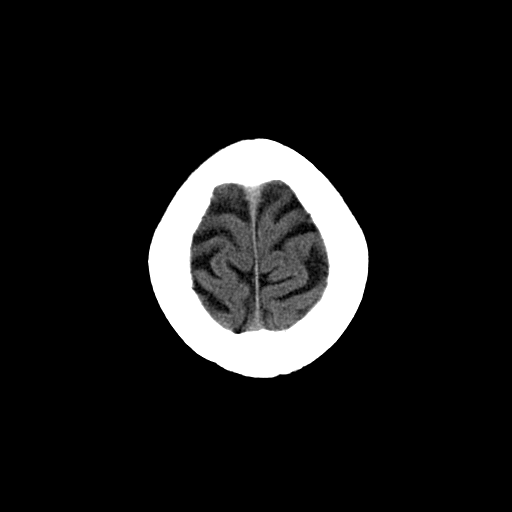
[im 26/28  brain]
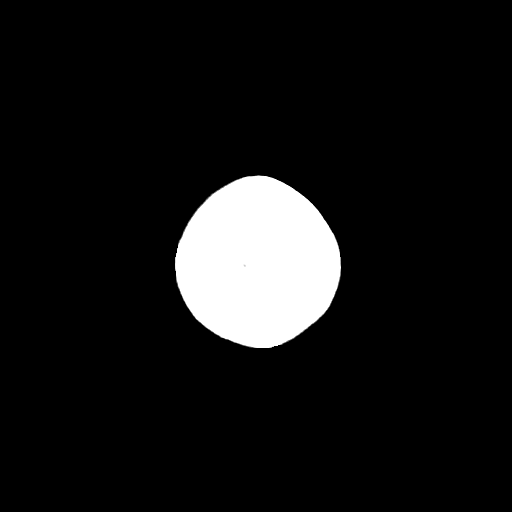
[im 26/28  bone]
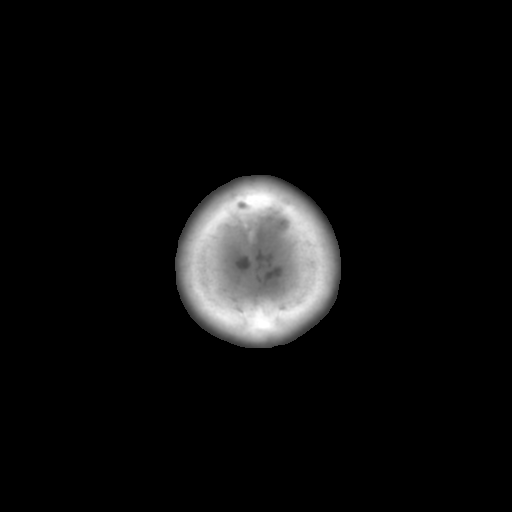

[16 of 30 positions shown; findings below may reference images not displayed]

FINDINGS: No evidence of parenchymal hemorrhage or extra-axial fluid
collection. No mass lesion, mass effect, or midline shift.

No CT evidence of acute infarction.

Mild cortical atrophy.  No ventriculomegaly.

The visualized paranasal sinuses are essentially clear. The mastoid
air cells are unopacified.

No evidence of calvarial fracture.
IMPRESSION: No evidence of acute intracranial abnormality.

## 2016-07-02 ENCOUNTER — Ambulatory Visit: Payer: Medicare Other | Admitting: Neurology
